# Patient Record
Sex: Male | Born: 1935 | Race: Black or African American | Hispanic: No | Marital: Single | Smoking: Former smoker
Health system: Southern US, Community
[De-identification: ages and names within clinical notes are randomized; demographics above are authoritative.]

## PROBLEM LIST (undated history)

## (undated) DIAGNOSIS — D649 Anemia, unspecified: Secondary | ICD-10-CM

## (undated) DIAGNOSIS — I351 Nonrheumatic aortic (valve) insufficiency: Secondary | ICD-10-CM

## (undated) DIAGNOSIS — I77810 Thoracic aortic ectasia: Secondary | ICD-10-CM

## (undated) DIAGNOSIS — I10 Essential (primary) hypertension: Secondary | ICD-10-CM

## (undated) DIAGNOSIS — I4891 Unspecified atrial fibrillation: Secondary | ICD-10-CM

## (undated) DIAGNOSIS — Z86010 Personal history of colonic polyps: Secondary | ICD-10-CM

## (undated) DIAGNOSIS — K648 Other hemorrhoids: Principal | ICD-10-CM

## (undated) DIAGNOSIS — Z972 Presence of dental prosthetic device (complete) (partial): Secondary | ICD-10-CM

## (undated) DIAGNOSIS — Z8673 Personal history of transient ischemic attack (TIA), and cerebral infarction without residual deficits: Secondary | ICD-10-CM

## (undated) DIAGNOSIS — E785 Hyperlipidemia, unspecified: Secondary | ICD-10-CM

## (undated) DIAGNOSIS — I639 Cerebral infarction, unspecified: Secondary | ICD-10-CM

## (undated) DIAGNOSIS — I499 Cardiac arrhythmia, unspecified: Secondary | ICD-10-CM

## (undated) DIAGNOSIS — R0789 Other chest pain: Secondary | ICD-10-CM

## (undated) DIAGNOSIS — N183 Chronic kidney disease, stage 3 (moderate): Secondary | ICD-10-CM

## (undated) DIAGNOSIS — K649 Unspecified hemorrhoids: Secondary | ICD-10-CM

## (undated) HISTORY — PX: MULTIPLE TOOTH EXTRACTIONS: SHX2053

## (undated) HISTORY — DX: Unspecified atrial fibrillation: I48.91

## (undated) HISTORY — DX: Other chest pain: R07.89

## (undated) HISTORY — DX: Personal history of colonic polyps: Z86.010

## (undated) HISTORY — DX: Personal history of transient ischemic attack (TIA), and cerebral infarction without residual deficits: Z86.73

## (undated) HISTORY — DX: Hyperlipidemia, unspecified: E78.5

## (undated) HISTORY — DX: Unspecified hemorrhoids: K64.9

## (undated) HISTORY — DX: Anemia, unspecified: D64.9

## (undated) HISTORY — DX: Other hemorrhoids: K64.8

## (undated) HISTORY — DX: Essential (primary) hypertension: I10

## (undated) HISTORY — DX: Chronic kidney disease, stage 3 (moderate): N18.3

---

## 2000-08-14 ENCOUNTER — Inpatient Hospital Stay (HOSPITAL_COMMUNITY): Admission: EM | Admit: 2000-08-14 | Discharge: 2000-08-15 | Payer: Self-pay | Admitting: Emergency Medicine

## 2000-08-14 ENCOUNTER — Encounter: Payer: Self-pay | Admitting: Emergency Medicine

## 2000-08-15 ENCOUNTER — Encounter: Payer: Self-pay | Admitting: Internal Medicine

## 2000-08-24 ENCOUNTER — Encounter: Admission: RE | Admit: 2000-08-24 | Discharge: 2000-08-24 | Payer: Self-pay

## 2003-02-24 ENCOUNTER — Encounter (INDEPENDENT_AMBULATORY_CARE_PROVIDER_SITE_OTHER): Payer: Self-pay | Admitting: *Deleted

## 2003-02-24 ENCOUNTER — Ambulatory Visit (HOSPITAL_COMMUNITY): Admission: RE | Admit: 2003-02-24 | Discharge: 2003-02-24 | Payer: Self-pay | Admitting: *Deleted

## 2003-02-24 DIAGNOSIS — Z860101 Personal history of adenomatous and serrated colon polyps: Secondary | ICD-10-CM | POA: Insufficient documentation

## 2003-02-24 DIAGNOSIS — Z86010 Personal history of colonic polyps: Secondary | ICD-10-CM

## 2003-02-24 HISTORY — DX: Personal history of adenomatous and serrated colon polyps: Z86.0101

## 2003-02-24 HISTORY — DX: Personal history of colonic polyps: Z86.010

## 2003-12-02 ENCOUNTER — Ambulatory Visit (HOSPITAL_COMMUNITY): Admission: RE | Admit: 2003-12-02 | Discharge: 2003-12-02 | Payer: Self-pay | Admitting: Internal Medicine

## 2005-03-21 ENCOUNTER — Encounter (INDEPENDENT_AMBULATORY_CARE_PROVIDER_SITE_OTHER): Payer: Self-pay | Admitting: *Deleted

## 2005-03-21 ENCOUNTER — Ambulatory Visit (HOSPITAL_COMMUNITY): Admission: RE | Admit: 2005-03-21 | Discharge: 2005-03-21 | Payer: Self-pay | Admitting: *Deleted

## 2005-05-31 ENCOUNTER — Encounter: Payer: Self-pay | Admitting: Cardiovascular Disease

## 2005-05-31 ENCOUNTER — Inpatient Hospital Stay (HOSPITAL_COMMUNITY): Admission: EM | Admit: 2005-05-31 | Discharge: 2005-06-01 | Payer: Self-pay | Admitting: Emergency Medicine

## 2005-05-31 ENCOUNTER — Ambulatory Visit: Payer: Self-pay | Admitting: Cardiovascular Disease

## 2005-06-30 DIAGNOSIS — Z8673 Personal history of transient ischemic attack (TIA), and cerebral infarction without residual deficits: Secondary | ICD-10-CM

## 2005-06-30 HISTORY — DX: Personal history of transient ischemic attack (TIA), and cerebral infarction without residual deficits: Z86.73

## 2006-05-08 ENCOUNTER — Emergency Department (HOSPITAL_COMMUNITY): Admission: EM | Admit: 2006-05-08 | Discharge: 2006-05-08 | Payer: Self-pay | Admitting: Emergency Medicine

## 2007-05-16 HISTORY — PX: COLONOSCOPY: SHX174

## 2007-05-28 ENCOUNTER — Ambulatory Visit (HOSPITAL_COMMUNITY): Admission: RE | Admit: 2007-05-28 | Discharge: 2007-05-28 | Payer: Self-pay | Admitting: *Deleted

## 2007-05-28 ENCOUNTER — Encounter (INDEPENDENT_AMBULATORY_CARE_PROVIDER_SITE_OTHER): Payer: Self-pay | Admitting: *Deleted

## 2007-05-28 DIAGNOSIS — Z860100 Personal history of colon polyps, unspecified: Secondary | ICD-10-CM

## 2007-05-28 HISTORY — DX: Personal history of colonic polyps: Z86.010

## 2007-05-28 HISTORY — DX: Personal history of colon polyps, unspecified: Z86.0100

## 2008-07-27 ENCOUNTER — Emergency Department (HOSPITAL_COMMUNITY): Admission: EM | Admit: 2008-07-27 | Discharge: 2008-07-27 | Payer: Self-pay | Admitting: *Deleted

## 2010-04-20 ENCOUNTER — Encounter
Admission: RE | Admit: 2010-04-20 | Discharge: 2010-04-20 | Payer: Self-pay | Source: Home / Self Care | Admitting: Internal Medicine

## 2010-09-27 NOTE — Op Note (Signed)
Adrian Daniels, ROWSER NO.:  [id number]   MEDICAL RECORD NO.:  [id number]          PATIENT TYPE:  AMB   LOCATION:  ENDO                         FACILITY:  [facility]   PHYSICIAN:  Georgiana Spinner, M.D.    DATE OF BIRTH:  Jun 19, 1934   DATE OF PROCEDURE:  05/28/2007  DATE OF DISCHARGE:                               OPERATIVE REPORT   PROCEDURE:  Colonoscopy.   INDICATIONS:  Colon polyps.   ANESTHESIA:  Fentanyl 50 mcg, Versed 6 mg.   PROCEDURE:  With the patient mildly sedated in the left lateral  decubitus position, a rectal examination was performed which was  unremarkable to my exam.  Subsequently the Pentax videoscopic  colonoscope was inserted in the rectum, passed under direct vision with  pressure applied to reach the cecum identified by ileocecal valve and  base of cecum both of which were photographed.  From this point,  colonoscope was slowly withdrawn taking circumferential views of colonic  mucosa stopping in the ascending colon near the hepatic flexure where a  polyp was seen, photographed and removed, first using snare cautery  technique setting of 20/150 blended current.  There was some residual  polyp remaining, so using hot biopsy forceps technique with the same  setting, we removed the remainder.  There was some bleeding oozing  mostly from this polyp site so I elected to inject epinephrine 2 mL to  it but the oozing continued so I placed a vascular clip on the area of  bleeding which seemed to stop it.  From this point the colonoscope was  then slowly withdrawn taking circumferential views of remaining colonic  mucosa after retrieving the polypoid tissue, stopping only in the rectum  which appeared normal on direct and showed hemorrhoids on retroflexed  view.  The endoscope was straightened and withdrawn.  The patient's  vital signs, pulse oximeter remained stable.  The patient tolerated  procedure well without apparent complication other than what  appeared to  be self-limited bleeding.   PLAN:  Will await biopsy report.  The patient will call me for results  and follow-up with me as needed as an outpatient.           ______________________________  Georgiana Spinner, M.D.     GMO/MEDQ  D:  05/28/2007  T:  05/28/2007  Job:  [id number]

## 2010-09-30 NOTE — Consult Note (Signed)
[facility]  Patient:    Adrian Daniels, Adrian Daniels                      MRN: [id number] Proc. Date: 08/14/00 Adm. Date:  [id number] Attending:  [name]                          Consultation Report  CHIEF COMPLAINT:  Chest pain.  HISTORY OF PRESENT ILLNESS:  This is a 76 year old African-American male with a history of hypertension, hyperlipidemia who developed substernal chest pain last evening for approximately 5 to 10 minutes but subsided and then recurred.  The pain increased with lying supine and improved with sitting up.  There was no association with activity.  There were no associated symptoms of nausea, vomiting, diaphoresis, or shortness of breath.  He awoke at 3 a.m. with severe chest pain with radiation to the left arm and hand numbness.  Cardiac risk factors include age, sex, family history of hypertension, hyperlipidemia, and history of tobacco abuse.  In the emergency room, he was given a GI cocktail and apparently his pain was relieved within 10 minutes of taking the GI cocktail.  PAST MEDICAL HISTORY:  Significant for hypertension, poorly controlled. Hyperlipidemia, not on Pravachol at this time.  Tobacco abuse, 80-pack-year. Heartburn.  Scrotal pain.  CURRENT MEDICATIONS: 1. Cardizem CD 180 mg q.d. 2. Dyazide 1 q.d. 3. Pravachol 20 mg q.d.  SOCIAL HISTORY:  He works full-time as a Psychiatric nurse.  He has an 8th grade education.  He has an 80-pack-year history of tobacco abuse.  Quit five years ago.  He usually drinks alcohol up to a pint every two weeks.  He is single.  ALLERGIES:  He has no known drug allergies.  FAMILY HISTORY:  His brother died at 45 of a MI.  His one brother died at 50, his first MI was at 15.  He has a mother who died at 35 of questionable causes.  His father died at 28 of questionable causes.  PHYSICAL EXAMINATION:  VITAL SIGNS:  Blood pressure 130 to 158/70 to 90 mmHg.  Heart rate is 60 to 70 beats  per minute.  GENERAL:  This is a well-developed, well-nourished black male in no acute distress.  HEENT:  Pupils are equal, round and reactive to light and accommodation. Extraocular muscles intact bilaterally.  No sclerae icterus.  Oropharynx is clear.  NECK:  Supple without lymphadenopathy.  No bruits.  LUNGS:  Clear to auscultation throughout.  HEART:  Regular rate and rhythm with occasional ectopy.  No murmurs, rubs, or gallops.  Normal S1 and S2.  ABDOMEN:  Soft, nontender, and nondistended with active bowel sounds.  No hepatosplenomegaly.  EXTREMITIES:  No edema.  Good distal pulses bilaterally.  LABORATORY DATA:  Sodium 142, potassium 4.5, chloride 104, BUN 17, glucose 101.  White cell count 5.1, hemoglobin 12.7, platelet count 190,000. Troponin less than 0.01 and 0.01.  CPKs 205 and 158.  MBs 2.9 and 1.2.  Total cholesterol of 236, triglycerides 111, HDL 39, LDL 175.  EKG shows sinus bradycardia with no ST or T-wave abnormality.  ASSESSMENT/PLAN: 1. Chest pain with positive cardiac risk factors including hypertension,    hyperlipidemia, family history of coronary artery disease, positive tobacco    abuse, age, and sex:  His chest pain now is very atypical for underlying    coronary disease when it is relieved with a gastrointestinal cocktail.  Electrocardiogram shows no ischemia.  He has ruled out for a myocardial    infarction.  Given his strong cardiac risk factors would proceed with    stress Cardiolite and if negative discharge to home on a proton pump    inhibitor.  It is okay at this time to discontinue heparin and intravenous    nitroglycerin drip. 2. Hypertension:  Agree with the current medication changes to stop the    Cardizem given his bradycardia and to restart his Dyazide.  We may need    to add a second agent as his blood pressure is not well controlled on    Dyazide alone. 3. Hyperlipidemia:  Currently off Pravachol.  His LDL today was 175.     Realistically, we would like to have the LDL less than 130 given that he    has greater than two cardiac risk factors for coronary artery disease.    Would restart Pravachol 20 mg a day and recheck his lipids in six weeks. DD:  08/14/00 TD:  08/14/00 Job: [id number]

## 2010-09-30 NOTE — Discharge Summary (Signed)
[place] Adrian Daniels  Patient:    Adrian Daniels, Adrian Daniels                      MRN: [id number] Adm. Date:  [id number] Disc. Date: [id number] Attending:  [name] Dictator:   Dominica Severin, M.S.-IV CC:         [name], M.D.  Outpatient Clinic  Armanda Magic, M.D.  [employer] [employer]   Discharge Summary  DISCHARGE DIAGNOSES: 1. Atypical chest pain, status post negative Cardiolite stress test. 2. Hypertension. 3. Hyperlipidemia. 4. Gastroesophageal reflux disease. 5. Scrotal pain.  DISCHARGE MEDICATIONS: 1. Pepcid 20 mg p.o. b.i.d. 2. Pravachol 20 mg p.o. b.i.d. 3. Enteric-coated aspirin 325 mg p.o. q.d. 4. Dyazide one tablet p.o. q.d.  FOLLOW-UP:  Appointment with Dominica Severin, M.S.-IV, in the outpatient clinic at 2:30 p.m. on August 24, 2000.  I would like a urinalysis and urine culture drawn at the beginning of the laboratory appointment.  PROCEDURES:  A chest x-ray on admission showed mild cardiac enlargement with no signs of edema or infiltrate.  EKG showed normal sinus rhythm with one PVC and no ST changes or Q waves.  A Cardiolite stress EKG on August 15, 2000, showed no ischemic changes or chest pain during exercise.  The blood pressure rose to 190/110 with exercise, but there were no signs of ischemia.  There was no scar.  The ejection fraction was 61%.  CONSULTANTS:  Armanda Magic, M.D., in cardiology.  HISTORY OF PRESENT ILLNESS:  This is a 76 year old African-American male who had been having substernal chest pressure at about 8:30 p.m. the night prior to admission.  The pain lasted about 5-10 minutes.  It would go away for about 10-15 minutes and then return.  It seemed to worsen when he laid down, improved when he sat up, and was unrelated to activity.  He denied nausea, vomiting, shortness of breath, palpitations, or diaphoresis.  It did not radiate.  The pain seemed to improve after a few hours.  He  awoke a few hours later and the pain was more severe.  Then it was accompanied by left hand numbness.  His pain was not relieved by Maalox, Pepcid, or aspirin.  The patients cardiac risk factors include a positive family history of MI ______ young, his age, his gender, hypertension, hyperlipidemia, and recent smoker.  SOCIAL HISTORY:  The patient has an 80-pack-year smoking history, but he quit five years ago.  He has only occasional drinking.  He does not do illicit drugs.  MEDICATIONS:  The patient is on two blood pressure medicines, but he does not know which.  He used to take Pravachol for his hyperlipidemia, but has run out.  FAMILY HISTORY:  One of his brothers died of an MI at 60.  Another brother died of an MI at 69; his first MI was at 61.  His mother died at age 38 of a CVA.  His father died at 47 of unknown causes.  REVIEW OF SYSTEMS:  As stated above.  In addition, he has been suffering from occasional scrotal pain over the last six months.  PHYSICAL EXAMINATION ON ADMISSION:  VITAL SIGNS:  Temperature 97.7 degrees, pulse 80, respiratory rate 16, oxygen saturation 99% on room air, blood pressure 205/108 (decreased to 132/89 with labetalol).  GENERAL APPEARANCE:  This is a pleasant, African-American man in no apparent distress.  HEENT:  Pupils equal, round, and reactive to light and accommodation. Anicteric.  NECK:  Supple without bruits.  LUNGS:  Clear to auscultation bilaterally.  CARDIOVASCULAR:  Regular rate and rhythm without murmurs, rubs, or gallops.  ABDOMEN:  Soft, obese.  Positive bowel sounds.  Nontender.  No hepatosplenomegaly.  No bruits.  EXTREMITIES:  No clubbing, cyanosis, or edema.  Good pulses.  GENITOURINARY:  No hernia, no tenderness, no erythema, and no discharge.  RECTAL:  Heme-negative, brown stool.  Normal prostate.  LABORATORY DATA ON ADMISSION:  White count 5.1, hemoglobin 12.7, platelets 190, ANC 30.2, ALC 1.4.  Troponin I less  than 0.1.  CK 205, MB 2.9. On August 14, 2000, at 3:30 a.m., creatinine 1.2, total bilirubin 0.8, alkaline phosphatase 65, SGOT 19, SGPT 16, total protein 7.2, albumin 4.1, and calcium 9.5.  The urinalysis is pending at the time of discharge.  HOSPITAL COURSE: #1 - CARDIOLOGY:  The patient was admitted to telemetry and cardiac enzymes were measured.  The results of these enzymes were as follows:  On August 14, 2000, 0330 hours, CK 205, CK-MB 2.9, and troponin I 0.01.  On August 14, 2000, at 1130  hours, CK 158, CK-MB 1.2, and troponin I 0.01.  On August 14, 2000, at 1930 hours, CK 129, CK-MB 1.4, and troponin I 0.02.  The results of the fourth set of the cardiac enzymes are not available to this dictator at the time of dictation.  In addition, the patient had a fasting lipid panel drawn given his reported history of hyperlipidemia.  On August 14, 2000, the total cholesterol was 236, triglycerides 111, HDL 39, LDL 175, and total cholesterol to HDL ratio 6.1.  Given these results, the patient was placed on Pravachol, a medicine which he had been on in the past.  The patient had a history of hypertension.  Medicines that he had been on were retrieved from his records at [facility], Cardizem and Dyazide.  Given that the patients blood pressure had been in the normal range during his hospital stay, the patient was placed on Dyazide alone.  Given the patients multiple cardiac risk factors, he was placed on enteric-coated aspirin 325 mg per day.  Although the patients EKG and cardiac enzymes did not show signs of ischemia, it was decided that he would undergo a Cardiolite stress test given his multiple cardiac risk factors in order to rule out possible angina or cardiovascular disease.  The  Cardiolite stress test was negative as described above in the procedure part of this discharge summary.  #2 - GASTROINTESTINAL:  The patient has a past medical history of GERD.  Given this, his reflux symptoms may  explain his current chest pain.  He reports that he has been noncompliant on his Pepcid and was restarted on Pepcid 20 mg p.o. b.i.d.  It should be noted that the patient does report that his pain completely subsided with the GI cocktail in the ED.  #3 - GENITOURINARY:  The patient reports a two to three month history of bilateral scrotal pain.  He describes this as a dull and aching pain that does not change with activity and does not effect his erectile function.  The patient had a completely normal genital exam without discharge, tenderness, or prostate enlargement.  A urinalysis was ordered, but no completed during the hospital stay.  We will recommend follow-up of this problem on an outpatient basis. DD:  08/19/00 TD:  08/19/00 Job: [id number]

## 2010-09-30 NOTE — Op Note (Signed)
NAME:  Adrian Daniels, Adrian Daniels NO.:  [id number]   MEDICAL RECORD NO.:  [id number]          PATIENT TYPE:  AMB   LOCATION:  ENDO                         FACILITY:  [facility]   PHYSICIAN:  Georgiana Spinner, M.D.    DATE OF BIRTH:  Jul 13, 1934   DATE OF PROCEDURE:  03/21/2005  DATE OF DISCHARGE:                                 OPERATIVE REPORT   __________   __________    With the patient mildly sedated in left lateral decubitus position the  Olympus videoscopic colonoscope was inserted in the rectum after a normal  rectal exam and passed under direct vision to the cecum identified by  ileocecal valve and appendiceal orifice both of which were photographed.  From this point colonoscope was slowly withdrawn taking circumferential  views of the colonic mucosa as we withdrew all the way to the rectum  stopping at the splenic flexure first where a polyp was seen, photographed  and removed using hot biopsy forceps technique setting at 20/200 blended  current.  We next stopped at 50 cm from the anal verge at which point a  polyp was seen and it was removed using snare cautery technique again with  the same setting. There was some residual polypoid tissue left that I burned  using the hot biopsy forceps to eradicate it.  We next stopped at 20 cm from  the anal verge at which point another polyp was seen.  This was photographed  and removed with hot biopsy forceps technique with same setting.  The rectum  appeared normal on direct showed hemorrhoids retroflexed view.  The  endoscope straightened withdrawn.  The patient's vital signs, pulse oximeter  remained stable. The patient tolerated procedure well without apparent  complications.   FINDINGS:  Polyps described above splenic flexure 15 to 20 cm from anal  verge.   PLAN:  Await biopsy reports. The patient will call me for results and follow-  up with me as an outpatient.           ______________________________  Georgiana Spinner,  M.D.     GMO/MEDQ  D:  03/21/2005  T:  03/21/2005  Job:  [id number]

## 2010-09-30 NOTE — Discharge Summary (Signed)
NAMECORLEY, MAFFEO NO.:  [id number]   MEDICAL RECORD NO.:  [id number]          PATIENT TYPE:  INP   LOCATION:  3031                         FACILITY:  [facility]   PHYSICIAN:  Hillery Aldo, M.D.   DATE OF BIRTH:  14-Feb-1935   DATE OF ADMISSION:  05/31/2005  DATE OF DISCHARGE:  06/01/2005                                 DISCHARGE SUMMARY   DISCHARGE DIAGNOSES:  1.  Transient ischemic attack.  2.  Hypertension.  3.  Hyperlipidemia.  4.  Gastroesophageal reflux disease.  5.  Adenomatous polyps.  6.  Thoracic aortic aneurysm.   DISCHARGE MEDICATIONS:  1.  Aggrenox 1 capsule b.i.d.  2.  Avapro 300 milligrams daily.  3.  Lipitor 80 milligrams daily.  4.  Cardizem LA 360 milligrams daily.  5.  Triamterene/HCTZ 37.5/25 one p.o. daily.   CONSULTATIONS:  None.   BRIEF ADMISSION/HISTORY OF PRESENT ILLNESS:  Mr. Meech is a 76 year old  male with past medical history of hypertension and hyperlipidemia who  presented to [facility] ER with a new onset of left-sided numbness. It had  completely gone by the time he was seen in the emergency department. He was  admitted for full stroke workup.   PROCEDURES/DIAGNOSTIC STUDIES:  1.  MRI of the brain on May 31, 2005 showed no acute infarct or abnormal      intracranial enhancing lesions. There was moderate nonspecific white      mater-type changes related to sequelae of small vessel disease. MRA      showed mild intracranial atherosclerotic-type changes predominantly      involving the branch vessel.  2.  2-D echocardiogram on May 31, 2005 revealed normal LV function.  3.  Swallowing evaluation on May 31, 2005 showed normal swallowing      function.  4.  Carotid Dopplers on June 01, 2005 showed no significant ICA stenosis      bilaterally with vertebral artery flow antegrade.   HOSPITAL COURSE:  Problem 1:  FOCAL NEUROLOGIC DEFICITS CONSISTENT WITH  TRANSIENT ISCHEMIC ATTACK: The patient was admitted  and was evaluated by the  stroke team. He was not felt to be tPA candidate secondary to his rapidly  improving clinical course and his greater than three-hour presentation since  onset. Nonetheless, he received a full stroke evaluation including MRI/MRA,  2-D echocardiogram, carotid Doppler ultrasonography and a check of his  homocystine and lipid studies. Additionally, because of his left arm  symptoms, cardiac enzymes were cycled and found to be negative. Medical  management was undertaken with the switch of his aspirin to Aggrenox and  with focus on the control of his hypertension to a systolic pressure less  than 130 and strict control of his lipids. Given this, his Lipitor dose was  increased. He was not found to be hyperhomocysteinemia.   Problem 2:  HYPERTENSION: The patient was restarted on his home medications  once he was cleared from having had a stroke. He should see his primary care  physician in close follow-up and the goal would be to keep his systolic  blood pressure less than 130.  Problem 3:  HYPERLIPIDEMIA: The patient's fasting lipid panel was checked  with a total cholesterol found to be 164, triglycerides 58, HDL 49 and LDL  103. Given his elevated LDL, his Lipitor was increased to 80 milligrams  daily. He should have a check of his liver function studies and a repeat  fasting lipid panel in approximately six weeks to ensure that he is  optimally controlled.   DISPOSITION:  The patient is discharged home. He was evaluated by both  physical therapy and occupational therapy who did not find any deficits in  his motor function. He should follow up with his primary care physician in 1-  2 weeks. He should maintain a low-fat and low-cholesterol diet. Low salt as  well. He is instructed to call 9-1-1 if he experiences any focal neurologic  deficits.   CONDITION ON DISCHARGE:  Improved.           ______________________________  Hillery Aldo, M.D.     CR/MEDQ   D:  06/01/2005  T:  06/01/2005  Job:  [id number]

## 2010-09-30 NOTE — H&P (Signed)
NAMELEMOND, Adrian NO.:  [id number]   MEDICAL RECORD NO.:  [id number]          PATIENT TYPE:  INP   LOCATION:  1824                         FACILITY:  [facility]   PHYSICIAN:  Lonia Blood, M.D.       DATE OF BIRTH:  1935/03/15   DATE OF ADMISSION:  05/31/2005  DATE OF DISCHARGE:                                HISTORY & PHYSICAL   PRIMARY CARE PHYSICIAN:  Dr. Juline Patch   CHIEF COMPLAINT:  Left side numbness.   HISTORY OF PRESENT ILLNESS:  Mr. Hayter is a 76 year old African-American  man with history of hypertension, hyperlipidemia, and a strong family  history of CVA and coronary artery disease.  Presented to [facility]  Emergency Room with new onset left side numbness.  He reports the above  sensation to come up when he was watching T.V. around 1 a.m.  Initially the  upper extremity and the lower extremity was effected.  He apparently reports  that then his left upper extremity became less numb and his left lower  extremity remained numb.  He also reports that he was clumsy at some point  in his left upper extremity.  Currently, he does not report any weakness in  the left side.  He also denies any chest pain or shortness of breath with  this episode.  In the past Mr. Durden has been evaluated for possible  coronary artery disease and he had negative stress test in 2002.   HOME MEDICATIONS:  Cardizem, Avapro, Lipitor, and aspirin 81 mg daily.   ALLERGIES:  No known drug allergies.   PAST MEDICAL HISTORY:  1.  Hypertension.  2.  Hyperlipidemia.  3.  Thoracic aortic aneurysm measuring 4.3 x 4.2 cm.  4.  Edematous polyps found on colonoscopy in 2006.  5.  Gastroesophageal reflux disease.   SOCIAL HISTORY:  Mr. Bohlman is a former tobacco user at 80-pack-years.  Quit about eight years ago.  He drinks alcohol.  Worked in [employer].  Is  single.  Lives alone.  He has an eighth grade education.   FAMILY HISTORY:  His mother died at age 58 with a stroke.   His father died  at age 50 of unknown cause.  He had a brother die at age 42 with an MI.  Another brother died at age 56 with another MI.   REVIEW OF SYSTEMS:  Negative for chest pain.  Negative for headaches.  Negative for nausea.  Negative for vomiting.  Negative for abdominal pain.  Other systems per HPI.  All other systems are negative.   PHYSICAL EXAMINATION:  VITAL SIGNS:  Temperature 97, pulse 68, respirations  18, blood pressure 176/91, saturation 97% on room air.  GENERAL:  Patient appears well-developed, well-nourished, in no acute  distress.  Alert and oriented to place, person, and time.  HEENT:  Head is normocephalic, atraumatic.  Eyes have pupils equal and  round, react to light and accommodation.  Extraocular movements intact.  Sclerae anicteric.  Conjunctivae are pink.  Throat is clear.  NECK:  Supple without JVD.  No carotid bruits.  No  thyromegaly.  CHEST:  Clear to auscultation bilaterally without wheeze, rhonchi, or  crackles.  HEART:  Regular rate and rhythm without murmurs, rubs, or gallops.  ABDOMEN:  Soft, nontender, nondistended.  Bowel sounds are present.  There  is no palpable hepatosplenomegaly.  SKIN:  Warm and dry.  There is no suspicious rashes.  EXTREMITIES:  No edema.  MUSCULOSKELETAL:  Good bulk and tone.  NEUROLOGIC:  Cranial nerves III-XII are intact.  Patient has some slight  dysarthria, but 5/5 strength in upper extremity, lower extremity  bilaterally.  There is no appreciable pronator drift.  Deep tendon reflexes  are +2 and symmetric.  Sensation is intact.   LABORATORIES:  Urinalysis positive for some hemoglobin, otherwise within  normal limits.  White blood cell count 4.7, hemoglobin 12.5, platelet count  241.  Sodium 139, potassium 3.4, chloride 106, bicarbonate 29, BUN 10,  creatinine 1.1, glucose 97.  Liver function tests are within normal limits.  EKG shows normal sinus rhythm and a first degree AV block.  Head CT:  Some  small vessel  disease and no acute findings.   ASSESSMENT/PLAN:  1.  Transient ischemic attack versus cerebrovascular accident.  Mr.      Eiben's symptoms are consistent with the possibility of an ischemic      stroke in the right ACA distribution versus a lacunar infarct.  Plan is      to admit Mr. Broady to the telemetry unit, obtain MRI of his brain,      obtain carotid ultrasounds and 2-D echocardiogram.  Also, given his      history of left hand numbness will obtain three sets of cardiac enzymes,      rule out a myocardial event.  We will continue patient's aspirin for now      until we have a proven presence of a cerebrovascular accident and will      continue his Lipitor.  2.  Hypertension.  For now in the setting of an acute event we will be      holding the blood pressure medication and will follow up clinically.  3.  Hypokalemia.  Will replace patient's potassium.  4.  Microscopic hematuria.  Will repeat the UA and outpatient urology follow      up if the hematuria remains persistent.  5.  Anemia.  Patient had a recent colonoscopy with some polyps findings.      Will follow CBC to assure stability of hemoglobin.  6.  Deep venous thrombosis prophylaxis.  Will begin using Lovenox.      Lonia Blood, M.D.  Electronically Signed     SL/MEDQ  D:  05/31/2005  T:  05/31/2005  Job:  [id number]   cc:   Juline Patch, M.D.  Fax: [phone]

## 2010-09-30 NOTE — Op Note (Signed)
   NAME:  Adrian Daniels, Adrian Daniels NO.:  [id number]   MEDICAL RECORD NO.:  [id number]                   PATIENT TYPE:  AMB   LOCATION:  ENDO                                 FACILITY:  [facility]   PHYSICIAN:  Georgiana Spinner, M.D.                 DATE OF BIRTH:  06/21/1935   DATE OF PROCEDURE:  02/24/2003  DATE OF DISCHARGE:                                 OPERATIVE REPORT   PROCEDURE:  Colonoscopy.   INDICATIONS FOR PROCEDURE:  Colon polyp.   ANESTHESIA:  Demerol 60, Versed 6 mg.   DESCRIPTION OF PROCEDURE:  With the patient mildly sedated in the left  lateral decubitus position, the Olympus videoscopic colonoscope was inserted  in the rectum and passed under direct vision to the cecum identified by the  ileocecal valve and appendiceal orifice, both of which were photographed.  From this point, the colonoscope was slowly withdrawn taking circumferential  views of the colonic mucosa, stopping only in the rectosigmoid area at 20 cm  from the anal verge at which point, the polyp was seen, photographed, and  removed using snare cautery technique, setting of 20/20 blended current.  The endoscope and tissue was retrieved by suction.  The endoscope was  withdrawn further to the rectum which appeared normal on direct and  retroflexed view.  The endoscope was straightened and withdrawn.  The  patient's vital signs and pulse oximetry remained stable.  The patient  tolerated the procedure well without apparent complications.   FINDINGS:  Small polyp in the rectosigmoid removed.   PLAN:  Await biopsy report.  The patient will call me for results and follow  up with me as an outpatient.                                               Georgiana Spinner, M.D.    GMO/MEDQ  D:  02/24/2003  T:  02/24/2003  Job:  [id number]

## 2010-12-29 ENCOUNTER — Emergency Department (HOSPITAL_COMMUNITY)
Admission: EM | Admit: 2010-12-29 | Discharge: 2010-12-30 | Disposition: A | Discharge status: Home / Self Care | Payer: Medicare Other | Attending: Emergency Medicine | Admitting: Emergency Medicine

## 2010-12-29 DIAGNOSIS — I10 Essential (primary) hypertension: Secondary | ICD-10-CM | POA: Insufficient documentation

## 2010-12-29 DIAGNOSIS — Y92009 Unspecified place in unspecified non-institutional (private) residence as the place of occurrence of the external cause: Secondary | ICD-10-CM | POA: Insufficient documentation

## 2010-12-29 DIAGNOSIS — S0180XA Unspecified open wound of other part of head, initial encounter: Secondary | ICD-10-CM | POA: Insufficient documentation

## 2011-10-17 DIAGNOSIS — I10 Essential (primary) hypertension: Secondary | ICD-10-CM | POA: Diagnosis not present

## 2011-10-18 DIAGNOSIS — I10 Essential (primary) hypertension: Secondary | ICD-10-CM | POA: Diagnosis not present

## 2011-10-18 DIAGNOSIS — E7800 Pure hypercholesterolemia, unspecified: Secondary | ICD-10-CM | POA: Diagnosis not present

## 2012-02-14 DIAGNOSIS — K922 Gastrointestinal hemorrhage, unspecified: Secondary | ICD-10-CM | POA: Diagnosis not present

## 2012-03-06 ENCOUNTER — Other Ambulatory Visit: Payer: Self-pay | Admitting: Internal Medicine

## 2012-03-06 DIAGNOSIS — R7989 Other specified abnormal findings of blood chemistry: Secondary | ICD-10-CM

## 2012-03-06 DIAGNOSIS — R799 Abnormal finding of blood chemistry, unspecified: Secondary | ICD-10-CM | POA: Diagnosis not present

## 2012-03-13 ENCOUNTER — Ambulatory Visit
Admission: RE | Admit: 2012-03-13 | Discharge: 2012-03-13 | Disposition: A | Discharge status: Home / Self Care | Payer: Medicare Other | Source: Ambulatory Visit | Attending: Internal Medicine | Admitting: Internal Medicine

## 2012-03-13 DIAGNOSIS — R7989 Other specified abnormal findings of blood chemistry: Secondary | ICD-10-CM

## 2012-03-13 DIAGNOSIS — R799 Abnormal finding of blood chemistry, unspecified: Secondary | ICD-10-CM | POA: Diagnosis not present

## 2012-04-19 DIAGNOSIS — I10 Essential (primary) hypertension: Secondary | ICD-10-CM | POA: Diagnosis not present

## 2012-04-24 DIAGNOSIS — I10 Essential (primary) hypertension: Secondary | ICD-10-CM | POA: Diagnosis not present

## 2012-04-24 DIAGNOSIS — R634 Abnormal weight loss: Secondary | ICD-10-CM | POA: Diagnosis not present

## 2012-04-24 DIAGNOSIS — H6120 Impacted cerumen, unspecified ear: Secondary | ICD-10-CM | POA: Diagnosis not present

## 2012-04-24 DIAGNOSIS — J309 Allergic rhinitis, unspecified: Secondary | ICD-10-CM | POA: Diagnosis not present

## 2012-04-24 DIAGNOSIS — E785 Hyperlipidemia, unspecified: Secondary | ICD-10-CM | POA: Diagnosis not present

## 2012-04-24 DIAGNOSIS — Z23 Encounter for immunization: Secondary | ICD-10-CM | POA: Diagnosis not present

## 2012-04-26 ENCOUNTER — Encounter: Payer: Self-pay | Admitting: Gastroenterology

## 2012-04-30 ENCOUNTER — Ambulatory Visit: Payer: Medicare Other | Admitting: Gastroenterology

## 2012-05-23 ENCOUNTER — Encounter: Payer: Self-pay | Admitting: Gastroenterology

## 2012-05-23 ENCOUNTER — Ambulatory Visit (INDEPENDENT_AMBULATORY_CARE_PROVIDER_SITE_OTHER): Payer: Medicare Other | Admitting: Gastroenterology

## 2012-05-23 VITALS — BP 152/84 | HR 74 | Ht 71.0 in | Wt 191.8 lb

## 2012-05-23 DIAGNOSIS — Z86010 Personal history of colonic polyps: Secondary | ICD-10-CM

## 2012-05-23 MED ORDER — PEG-KCL-NACL-NASULF-NA ASC-C 100 G PO SOLR: 1.0000 | Freq: Once | ORAL | Status: DC

## 2012-05-23 NOTE — Patient Instructions (Addendum)
You have been scheduled for a colonoscopy with propofol. Please follow written instructions given to you at your visit today.  Please pick up your prep kit at the pharmacy within the next 1-3 days. If you use inhalers (even only as needed) or a CPAP machine, please bring them with you on the day of your procedure. We have sent the following medications to your pharmacy for you to pick up at your convenience: MOVI PREP CC:  Juline Patch MD

## 2012-05-23 NOTE — Progress Notes (Signed)
History of Present Illness:  This is a 78 year old African American male former patient of Dr. Sabino Gasser at [facility].  He had a colonoscopy in 2006 with removal of a benign polyp, and had a larger hepatic flexure polyp piecemeal excised in 2009.  There is not been colonoscopy followup since that time.  The patient now presents with intermittent rectal bleeding, but otherwise is asymptomatic.  He denies upper GI, hepatobiliary, or other general medical problems and works every day.  His appetite is good, he denies food intolerances.  He is on aspirin 325 mg a day and antihypertensive medications.  Recent review of lab data shows normal CBC a metabolic profile.  Family history is noncontributory.  I have reviewed this patient's present history, medical and surgical past history, allergies and medications.     ROS: The remainder of the 10 point ROS is negative     Physical Exam: Blood pressure 152/84, pulse 74, and weight 191 with a BMI of 26.75. General well developed well nourished patient in no acute distress, appearing their stated age Eyes PERRLA, no icterus, fundoscopic exam per opthamologist Skin no lesions noted Neck supple, no adenopathy, no thyroid enlargement, no tenderness Chest clear to percussion and auscultation Heart no significant murmurs, gallops or rubs noted Abdomen no hepatosplenomegaly masses or tenderness, BS normal.  Rectal inspection normal no fissures, or fistulae noted.  No masses or tenderness on digital exam. Stool guaiac negative. Extremities no acute joint lesions, edema, phlebitis or evidence of cellulitis. Neurologic patient oriented x 3, cranial nerves intact, no focal neurologic deficits noted. Psychological mental status normal and normal affect.  Assessment and plan: This patient is due for followup colonoscopy exam per his prominent hepatic flexure polyp.  I cannot find pathology report from 2009.  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  I have set him up for colonoscopy as per standard protocol with adjustments in his medications as needed.  I did not repeat any laboratory parameters at today.  Risk and benefits of colonoscopy were explained in detail to the patient and his door, and we will proceed as planned with conscious sedation and propofol nurse anesthesia.  These copies primary care doctor Dr. Juline Patch.  Previous colonoscopy reported internal hemorrhoids, and I suspect that accounts for his bright red blood per rectum.  I cannot feel any rectal masses on digital exam, and stool is guaiac-negative. Encounter Diagnosis  Name Primary?  Marland Kitchen Hx of colonic polyps Yes

## 2012-05-27 ENCOUNTER — Encounter: Payer: Medicare Other | Admitting: Gastroenterology

## 2012-05-28 DIAGNOSIS — R42 Dizziness and giddiness: Secondary | ICD-10-CM | POA: Diagnosis not present

## 2012-05-28 DIAGNOSIS — I10 Essential (primary) hypertension: Secondary | ICD-10-CM | POA: Diagnosis not present

## 2012-05-28 DIAGNOSIS — R5383 Other fatigue: Secondary | ICD-10-CM | POA: Diagnosis not present

## 2012-05-28 DIAGNOSIS — R5381 Other malaise: Secondary | ICD-10-CM | POA: Diagnosis not present

## 2012-06-05 ENCOUNTER — Encounter: Payer: Self-pay | Admitting: *Deleted

## 2012-06-05 DIAGNOSIS — R42 Dizziness and giddiness: Secondary | ICD-10-CM | POA: Diagnosis not present

## 2012-06-05 DIAGNOSIS — R55 Syncope and collapse: Secondary | ICD-10-CM | POA: Diagnosis not present

## 2012-06-10 DIAGNOSIS — R42 Dizziness and giddiness: Secondary | ICD-10-CM | POA: Diagnosis not present

## 2012-06-10 DIAGNOSIS — R55 Syncope and collapse: Secondary | ICD-10-CM | POA: Diagnosis not present

## 2012-07-08 ENCOUNTER — Telehealth: Payer: Self-pay | Admitting: *Deleted

## 2012-07-08 NOTE — Telephone Encounter (Signed)
Pt was to see his PCP before another COLON could be r/s. Called pt today who stated he will call tomorrow to r/s the COLON; he has seen Dr Ricki Miller.

## 2012-07-08 NOTE — Telephone Encounter (Signed)
Message copied by Florene Glen on Mon Jul 08, 2012  4:27 PM ------      Message from: Florene Glen      Created: Wed Jun 12, 2012 11:26 AM       Will need i care re-evaluation before scheduling for colonoincopy...went to ER today before exam and thus cancelled ------

## 2012-07-17 DIAGNOSIS — R634 Abnormal weight loss: Secondary | ICD-10-CM | POA: Diagnosis not present

## 2012-07-23 DIAGNOSIS — I10 Essential (primary) hypertension: Secondary | ICD-10-CM | POA: Diagnosis not present

## 2012-07-23 DIAGNOSIS — Z0000 Encounter for general adult medical examination without abnormal findings: Secondary | ICD-10-CM | POA: Diagnosis not present

## 2012-10-03 ENCOUNTER — Emergency Department (HOSPITAL_COMMUNITY)
Admission: EM | Admit: 2012-10-03 | Discharge: 2012-10-03 | Disposition: A | Discharge status: Home / Self Care | Payer: Medicare Other | Attending: Emergency Medicine | Admitting: Emergency Medicine

## 2012-10-03 ENCOUNTER — Encounter (HOSPITAL_COMMUNITY): Payer: Self-pay | Admitting: Emergency Medicine

## 2012-10-03 DIAGNOSIS — Z9889 Other specified postprocedural states: Secondary | ICD-10-CM | POA: Diagnosis not present

## 2012-10-03 DIAGNOSIS — E785 Hyperlipidemia, unspecified: Secondary | ICD-10-CM | POA: Diagnosis not present

## 2012-10-03 DIAGNOSIS — Z79899 Other long term (current) drug therapy: Secondary | ICD-10-CM | POA: Diagnosis not present

## 2012-10-03 DIAGNOSIS — Z862 Personal history of diseases of the blood and blood-forming organs and certain disorders involving the immune mechanism: Secondary | ICD-10-CM | POA: Insufficient documentation

## 2012-10-03 DIAGNOSIS — Z860100 Personal history of colon polyps, unspecified: Secondary | ICD-10-CM | POA: Insufficient documentation

## 2012-10-03 DIAGNOSIS — I10 Essential (primary) hypertension: Secondary | ICD-10-CM | POA: Diagnosis not present

## 2012-10-03 DIAGNOSIS — K921 Melena: Secondary | ICD-10-CM | POA: Insufficient documentation

## 2012-10-03 DIAGNOSIS — K648 Other hemorrhoids: Secondary | ICD-10-CM | POA: Insufficient documentation

## 2012-10-03 DIAGNOSIS — Z8679 Personal history of other diseases of the circulatory system: Secondary | ICD-10-CM | POA: Insufficient documentation

## 2012-10-03 DIAGNOSIS — K625 Hemorrhage of anus and rectum: Secondary | ICD-10-CM | POA: Insufficient documentation

## 2012-10-03 DIAGNOSIS — Z87891 Personal history of nicotine dependence: Secondary | ICD-10-CM | POA: Insufficient documentation

## 2012-10-03 LAB — CBC WITH DIFFERENTIAL/PLATELET
Basophils Relative: 0 % (ref 0–1)
Eosinophils Absolute: 0 10*3/uL (ref 0.0–0.7)
Eosinophils Relative: 1 % (ref 0–5)
MCV: 83.5 fL (ref 78.0–100.0)
Monocytes Relative: 5 % (ref 3–12)
RBC: 4.06 MIL/uL — ABNORMAL LOW (ref 4.22–5.81)

## 2012-10-03 NOTE — ED Provider Notes (Signed)
History     CSN: [id number]  Arrival date & time 10/03/12  1140   First MD Initiated Contact with Patient 10/03/12 1155      Chief Complaint  Patient presents with  . Rectal Pain    (Consider location/radiation/quality/duration/timing/severity/associated sxs/prior treatment) HPI Comments: Patient with history of hemorrhoids and occasional, intermittent bleeding in the past presents today after having a large amount of bleeding with a bowel movement this morning. Patient estimates that he bled about "a half pint of blood". Blood was bright red. He did not pass out or feel short of breath. Prior to this morning, patient has not had bleeding in a week. He denies rectal pain. He is not on any blood thinners except for aspirin. Onset of symptoms acute. Course is resolved. Nothing makes symptoms better worse. History of colon polyps on previous colonoscopy in 2009.  The history is provided by the patient.    Past Medical History  Diagnosis Date  . History of colon polyps 05-28-2007    Adenomatous polyp(Colonoscopy with Dr. Virginia Rochester)  . Hypertension   . Hemorrhoids   . Hyperlipemia   . Anemia, unspecified     Past Surgical History  Procedure Laterality Date  . Unremarkable      Family History  Problem Relation Age of Onset  . Diabetes Daughter   . Cervical cancer Sister   . Colon cancer Neg Hx   . Colon polyps Neg Hx     History  Substance Use Topics  . Smoking status: Former Games developer  . Smokeless tobacco: Never Used  . Alcohol Use: Yes     Comment: 4 per week      Review of Systems  Constitutional: Negative for fever.  HENT: Negative for sore throat and rhinorrhea.   Eyes: Negative for redness.  Respiratory: Negative for cough.   Cardiovascular: Negative for chest pain.  Gastrointestinal: Positive for blood in stool. Negative for nausea, vomiting, abdominal pain and diarrhea.  Genitourinary: Negative for dysuria.  Musculoskeletal: Negative for myalgias.  Skin: Negative  for rash.  Neurological: Negative for headaches.    Allergies  Review of patient's allergies indicates no known allergies.  Home Medications   Current Outpatient Rx  Name  Route  Sig  Dispense  Refill  . atorvastatin (LIPITOR) 40 MG tablet   Oral   Take 40 mg by mouth daily.         Marland Kitchen diltiazem (CARDIZEM CD) 300 MG 24 hr capsule   Oral   Take 300 mg by mouth daily.         Marland Kitchen olmesartan (BENICAR) 40 MG tablet   Oral   Take 40 mg by mouth daily.         Marland Kitchen triamterene-hydrochlorothiazide (DYAZIDE) 37.5-25 MG per capsule   Oral   Take 1 capsule by mouth every morning.           BP 153/89  Pulse 76  Temp(Src) 99 F (37.2 C) (Oral)  Resp 16  Wt 194 lb 5 oz (88.14 kg)  BMI 27.11 kg/m2  SpO2 96%  Physical Exam  Nursing note and vitals reviewed. Constitutional: He appears well-developed and well-nourished.  HENT:  Head: Normocephalic and atraumatic.  Eyes: Conjunctivae are normal. Right eye exhibits no discharge. Left eye exhibits no discharge.  Neck: Normal range of motion. Neck supple.  Cardiovascular: Normal rate, regular rhythm and normal heart sounds.   Pulmonary/Chest: Effort normal and breath sounds normal.  Abdominal: Soft. There is no tenderness.  Genitourinary: Rectal exam  shows internal hemorrhoid (2, prolapsed, reducible). Rectal exam shows no external hemorrhoid, no fissure, no mass, no tenderness and anal tone normal.  Neurological: He is alert.  Skin: Skin is warm and dry.  Psychiatric: He has a normal mood and affect.    ED Course  Procedures (including critical care time)  Labs Reviewed  CBC WITH DIFFERENTIAL - Abnormal; Notable for the following:    RBC 4.06 (*)    Hemoglobin 11.8 (*)    HCT 33.9 (*)    Platelets 135 (*)    All other components within normal limits   No results found.   1. Rectal bleeding     12:18 PM Patient seen and examined. Pt changing into gown.    Vital signs reviewed and are as follows: Filed Vitals:    10/03/12 1147  BP: 153/89  Pulse: 76  Temp: 99 F (37.2 C)  Resp: 16   Patient examined with nurse chaperone Thayer Ohm). Patient d/w and seen by Dr. Anitra Lauth.   CBC demonstrates hemoglobin in 11's. Patient has had no further rectal bleeding in emergency department. Patient will followup with PCP and his gastroenterologist.  Patient urged to return with worsening bleeding, weakness, lightheadedness or syncope.   MDM  Lower GI bleeding suspected gross bloody stool, bright red blood per rectum. Patient has internal hemorrhoids on exam but no obvious bleeding from these at time of exam.   Bleeding stopped prior to ED arrival.   The following differential diagnoses were considered for this patient's lower GI bleed: *Hemorrhoids *Colonic polyp - history of same *Proctitis - usually associated with passage of mucus and diarrhea *Diverticulosis - large volume of painless bleeding, >40yo *Arteriovenous malformation  None of the following red flags identified or suspected: *Abnormal vital signs *Symptoms suggestive of malignancy such as constitutional symptoms (fever, weight loss), anemia, or change in frequency, caliber or consistency of stools * Family history of colon cancer  Patient has no abdominal pain in history or exam.   The patient appears reasonably screened and/or stabilized for discharge and I doubt any other medical condition or other emergency medical conditions requiring further screening, evaluation, or treatment in the ED at this time prior to discharge.  Age greater than 40 -- GI/PCP referral for sigmoidoscopy or colonoscopy        Renne Crigler, PA-C 10/03/12 1510

## 2012-10-03 NOTE — ED Notes (Signed)
Had hard bm this am and now hemorrids are bleeding is not on blood thinners but takes an asa

## 2012-10-03 NOTE — ED Provider Notes (Signed)
Medical screening examination/treatment/procedure(s) were conducted as a shared visit with non-physician practitioner(s) and myself.  I personally evaluated the patient during the encounter Patient with bright red rectal bleeding today when he used the bathroom. 2 reducible internal hemorrhoids on exam that are not thrombosed. Patient has no abdominal pain, syncope, dizziness, chest pain or shortness of breath. Hemoglobin stable  Gwyneth Sprout, MD 10/03/12 1523

## 2012-10-08 ENCOUNTER — Telehealth: Payer: Self-pay | Admitting: *Deleted

## 2012-10-08 NOTE — Telephone Encounter (Signed)
Message copied by Florene Glen on Tue Oct 08, 2012  8:35 AM ------      Message from: Jarold Motto, DAVID R      Created: Fri Oct 04, 2012 10:46 AM       He needs a primary care followup.  He never completed his colonoscopy exam as we requested. ------

## 2012-10-14 NOTE — Telephone Encounter (Signed)
Pt never called to schedule and was seen in the ER for rectal bleeding on 10/03/12. Wrote pt a letter asking him to call us.

## 2012-10-23 DIAGNOSIS — K625 Hemorrhage of anus and rectum: Secondary | ICD-10-CM | POA: Diagnosis not present

## 2012-10-23 DIAGNOSIS — I10 Essential (primary) hypertension: Secondary | ICD-10-CM | POA: Diagnosis not present

## 2012-10-28 ENCOUNTER — Encounter: Payer: Self-pay | Admitting: Gastroenterology

## 2012-11-04 ENCOUNTER — Encounter: Payer: Self-pay | Admitting: Gastroenterology

## 2012-11-22 ENCOUNTER — Ambulatory Visit (AMBULATORY_SURGERY_CENTER): Payer: Medicare Other | Admitting: *Deleted

## 2012-11-22 VITALS — Ht 71.0 in | Wt 198.4 lb

## 2012-11-22 DIAGNOSIS — Z86010 Personal history of colonic polyps: Secondary | ICD-10-CM

## 2012-11-22 MED ORDER — PEG-KCL-NACL-NASULF-NA ASC-C 100 G PO SOLR: 1.0000 | Freq: Once | ORAL | Status: DC

## 2012-11-22 NOTE — Progress Notes (Signed)
No egg or soy allergy. No anesthesia problems.  

## 2012-12-06 ENCOUNTER — Ambulatory Visit (AMBULATORY_SURGERY_CENTER): Payer: Medicare Other | Admitting: Gastroenterology

## 2012-12-06 ENCOUNTER — Encounter: Payer: Self-pay | Admitting: Gastroenterology

## 2012-12-06 VITALS — BP 164/75 | HR 59 | Temp 96.4°F | Resp 17 | Ht 71.0 in | Wt 198.0 lb

## 2012-12-06 DIAGNOSIS — Z860100 Personal history of colon polyps, unspecified: Secondary | ICD-10-CM

## 2012-12-06 DIAGNOSIS — Z1211 Encounter for screening for malignant neoplasm of colon: Secondary | ICD-10-CM | POA: Diagnosis not present

## 2012-12-06 DIAGNOSIS — I10 Essential (primary) hypertension: Secondary | ICD-10-CM | POA: Diagnosis not present

## 2012-12-06 DIAGNOSIS — D649 Anemia, unspecified: Secondary | ICD-10-CM | POA: Diagnosis not present

## 2012-12-06 MED ORDER — SODIUM CHLORIDE 0.9 % IV SOLN: 500.0000 mL | INTRAVENOUS | Status: DC

## 2012-12-06 NOTE — Progress Notes (Signed)
Procedure ends, to recovery, report given and VSS. 

## 2012-12-06 NOTE — Progress Notes (Signed)
Patient did not experience any of the following events: a burn prior to discharge; a fall within the facility; wrong site/side/patient/procedure/implant event; or a hospital transfer or hospital admission upon discharge from the facility. (G8907) Patient did not have preoperative order for IV antibiotic SSI prophylaxis. (G8918)  

## 2012-12-06 NOTE — Progress Notes (Signed)
NO EGG OR SOY ALLERGY. EMW 

## 2012-12-06 NOTE — Op Note (Addendum)
[facility] [address] [name], [address]   COLONOSCOPY PROCEDURE REPORT  PATIENT: Adrian Daniels, Adrian Daniels  MR#: [id number] BIRTHDATE: 15-Nov-1934 , 78  yrs. old GENDER: Male ENDOSCOPIST: Mardella Layman, MD, Clementeen Graham REFERRED BY:  Juline Patch, M.D. PROCEDURE DATE:  12/06/2012 PROCEDURE:   Colonoscopy, surveillance ASA CLASS:   Class II INDICATIONS:Patient's personal history of adenomatous colon polyps.  MEDICATIONS: Propofol (Diprivan) 160 mg IV  DESCRIPTION OF PROCEDURE:   After the risks and benefits and of the procedure were explained, informed consent was obtained.  A digital rectal exam revealed no abnormalities of the rectum.    The LB [id number]  endoscope was introduced through the anus and advanced to the cecum, which was identified by both the appendix and ileocecal valve .  The quality of the prep was adequate. .  The instrument was then slowly withdrawn as the colon was fully examined.     COLON FINDINGS: A normal appearing cecum, ileocecal valve, and appendiceal orifice were identified.  The ascending, hepatic flexure, transverse, splenic flexure, descending, sigmoid colon and rectum appeared unremarkable.  No polyps or cancers were seen.Small right colon lipoma and small cecal telangiectasiae noted,no bleeding.     Retroflexed views revealed external/internal hemorrhoids.     The scope was then withdrawn from the patient and the procedure completed.  COMPLICATIONS: There were no complications. ENDOSCOPIC IMPRESSION: Normal colon ..no recurrent polyps noted,small cecal telangiectasiae noted.  RECOMMENDATIONS: 1.  Continue current medications 2.  Given your age, you will not need another colonoscopy for colon cancer screening or polyp surveillance.  These types of tests usually stop around the age 79. 3. Consider hemorrhoidal banding with Dr. Leone Payor if bleeding worsens   REPEAT  EXAM:  cc:  _______________________________ eSigned:  Mardella Layman, MD, [facility] 12/06/2012 10:49 AM Revised: 12/06/2012 10:49 AM

## 2012-12-06 NOTE — Patient Instructions (Addendum)
Discharge instructions given with verbal understanding. Normal exam. Resume previous medications. YOU HAD AN ENDOSCOPIC PROCEDURE TODAY AT THE [facility]: Refer to the procedure report that was given to you for any specific questions about what was found during the examination.  If the procedure report does not answer your questions, please call your gastroenterologist to clarify.  If you requested that your care partner not be given the details of your procedure findings, then the procedure report has been included in a sealed envelope for you to review at your convenience later.  YOU SHOULD EXPECT: Some feelings of bloating in the abdomen. Passage of more gas than usual.  Walking can help get rid of the air that was put into your GI tract during the procedure and reduce the bloating. If you had a lower endoscopy (such as a colonoscopy or flexible sigmoidoscopy) you may notice spotting of blood in your stool or on the toilet paper. If you underwent a bowel prep for your procedure, then you may not have a normal bowel movement for a few days.  DIET: Your first meal following the procedure should be a light meal and then it is ok to progress to your normal diet.  A half-sandwich or bowl of soup is an example of a good first meal.  Heavy or fried foods are harder to digest and may make you feel nauseous or bloated.  Likewise meals heavy in dairy and vegetables can cause extra gas to form and this can also increase the bloating.  Drink plenty of fluids but you should avoid alcoholic beverages for 24 hours.  ACTIVITY: Your care partner should take you home directly after the procedure.  You should plan to take it easy, moving slowly for the rest of the day.  You can resume normal activity the day after the procedure however you should NOT DRIVE or use heavy machinery for 24 hours (because of the sedation medicines used during the test).    SYMPTOMS TO REPORT IMMEDIATELY: A gastroenterologist  can be reached at any hour.  During normal business hours, 8:30 AM to 5:00 PM Monday through Friday, call [phone].  After hours and on weekends, please call the GI answering service at [phone] who will take a message and have the physician on call contact you.   Following lower endoscopy (colonoscopy or flexible sigmoidoscopy):  Excessive amounts of blood in the stool  Significant tenderness or worsening of abdominal pains  Swelling of the abdomen that is new, acute  Fever of 100F or higher  FOLLOW UP: If any biopsies were taken you will be contacted by phone or by letter within the next 1-3 weeks.  Call your gastroenterologist if you have not heard about the biopsies in 3 weeks.  Our staff will call the home number listed on your records the next business day following your procedure to check on you and address any questions or concerns that you may have at that time regarding the information given to you following your procedure. This is a courtesy call and so if there is no answer at the home number and we have not heard from you through the emergency physician on call, we will assume that you have returned to your regular daily activities without incident.  SIGNATURES/CONFIDENTIALITY: You and/or your care partner have signed paperwork which will be entered into your electronic medical record.  These signatures attest to the fact that that the information above on your After Visit Summary has been reviewed   and is understood.  Full responsibility of the confidentiality of this discharge information lies with you and/or your care-partner. 

## 2012-12-09 ENCOUNTER — Telehealth: Payer: Self-pay

## 2012-12-09 NOTE — Telephone Encounter (Signed)
Left message on answering machine. 

## 2013-01-16 DIAGNOSIS — I10 Essential (primary) hypertension: Secondary | ICD-10-CM | POA: Diagnosis not present

## 2013-01-22 DIAGNOSIS — E785 Hyperlipidemia, unspecified: Secondary | ICD-10-CM | POA: Diagnosis not present

## 2013-01-22 DIAGNOSIS — I10 Essential (primary) hypertension: Secondary | ICD-10-CM | POA: Diagnosis not present

## 2013-01-22 DIAGNOSIS — Z23 Encounter for immunization: Secondary | ICD-10-CM | POA: Diagnosis not present

## 2013-07-23 DIAGNOSIS — D649 Anemia, unspecified: Secondary | ICD-10-CM | POA: Diagnosis not present

## 2013-07-23 DIAGNOSIS — I10 Essential (primary) hypertension: Secondary | ICD-10-CM | POA: Diagnosis not present

## 2013-07-23 DIAGNOSIS — E7800 Pure hypercholesterolemia, unspecified: Secondary | ICD-10-CM | POA: Diagnosis not present

## 2013-07-29 DIAGNOSIS — K649 Unspecified hemorrhoids: Secondary | ICD-10-CM | POA: Diagnosis not present

## 2013-07-29 DIAGNOSIS — I10 Essential (primary) hypertension: Secondary | ICD-10-CM | POA: Diagnosis not present

## 2013-07-29 DIAGNOSIS — J309 Allergic rhinitis, unspecified: Secondary | ICD-10-CM | POA: Diagnosis not present

## 2013-07-29 DIAGNOSIS — N400 Enlarged prostate without lower urinary tract symptoms: Secondary | ICD-10-CM | POA: Diagnosis not present

## 2013-07-29 DIAGNOSIS — Z0000 Encounter for general adult medical examination without abnormal findings: Secondary | ICD-10-CM | POA: Diagnosis not present

## 2013-07-29 DIAGNOSIS — E785 Hyperlipidemia, unspecified: Secondary | ICD-10-CM | POA: Diagnosis not present

## 2013-11-15 ENCOUNTER — Emergency Department (HOSPITAL_COMMUNITY)
Admission: EM | Admit: 2013-11-15 | Discharge: 2013-11-16 | Disposition: A | Discharge status: Home / Self Care | Payer: Medicare Other | Attending: Emergency Medicine | Admitting: Emergency Medicine

## 2013-11-15 ENCOUNTER — Encounter (HOSPITAL_COMMUNITY): Payer: Self-pay | Admitting: Emergency Medicine

## 2013-11-15 DIAGNOSIS — Z8719 Personal history of other diseases of the digestive system: Secondary | ICD-10-CM | POA: Insufficient documentation

## 2013-11-15 DIAGNOSIS — Z7982 Long term (current) use of aspirin: Secondary | ICD-10-CM | POA: Diagnosis not present

## 2013-11-15 DIAGNOSIS — E785 Hyperlipidemia, unspecified: Secondary | ICD-10-CM | POA: Diagnosis not present

## 2013-11-15 DIAGNOSIS — Z860100 Personal history of colon polyps, unspecified: Secondary | ICD-10-CM | POA: Insufficient documentation

## 2013-11-15 DIAGNOSIS — Z79899 Other long term (current) drug therapy: Secondary | ICD-10-CM | POA: Insufficient documentation

## 2013-11-15 DIAGNOSIS — I8000 Phlebitis and thrombophlebitis of superficial vessels of unspecified lower extremity: Secondary | ICD-10-CM | POA: Insufficient documentation

## 2013-11-15 DIAGNOSIS — I10 Essential (primary) hypertension: Secondary | ICD-10-CM | POA: Insufficient documentation

## 2013-11-15 DIAGNOSIS — Z862 Personal history of diseases of the blood and blood-forming organs and certain disorders involving the immune mechanism: Secondary | ICD-10-CM | POA: Diagnosis not present

## 2013-11-15 DIAGNOSIS — Z87891 Personal history of nicotine dependence: Secondary | ICD-10-CM | POA: Insufficient documentation

## 2013-11-15 DIAGNOSIS — I809 Phlebitis and thrombophlebitis of unspecified site: Secondary | ICD-10-CM

## 2013-11-15 NOTE — ED Notes (Signed)
Pt c/o knot in right upper thigh that formed last night, has bruising to same location. Denies injury to location. Denies taking blood thinners. Pt is ambulatory with no difficulties. Nad, skin warm and dry, resp e/u.

## 2013-11-15 NOTE — ED Provider Notes (Signed)
CSN: [id number]     Arrival date & time 11/15/13  2238 History  This chart was scribed for non-physician provider Zacarias Pontes, PA-C, working with Neta Ehlers, MD by Irene Pap, ED Scribe. This patient was seen in room TR09C/TR09C and patient care was started at 11:28 PM.     Chief Complaint  Patient presents with  . Leg Pain   Patient is a 79 y.o. male presenting with leg pain. The history is provided by the patient. No language interpreter was used.  Leg Pain Location:  Leg Time since incident:  1 day Injury: no   Leg location:  R upper leg Pain details:    Severity:  No pain Chronicity:  New Dislocation: no   Prior injury to area:  No Relieved by:  None tried Worsened by:  Nothing tried Ineffective treatments:  Compression Associated symptoms: no back pain, no decreased ROM, no fever, no muscle weakness, no neck pain, no numbness, no swelling and no tingling    HPI Comments: Adrian Daniels is a 79 y.o. male who presents to the Emergency Department complaining of right upper thigh knot onset one day ago. He states that the area is bruised and sore but does not remember running into anything or getting bit. He denies groin or testicle swelling, red streaking, or testicle abnormalities. He denies any history of similar symptoms. He states that he put a hot towel on the area but to no relief. He takes 325 mg of aspirin once a day. He denies a history of DVT, heart conditions or stroke. He denies CP, SOB, abdominal pain, leg swelling different than his baseline, nausea, vomiting, fever or chills. No recent travel or immobilization.   Past Medical History  Diagnosis Date  . History of colon polyps 05-28-2007    Adenomatous polyp(Colonoscopy with Dr. Lajoyce Corners)  . Hypertension   . Hemorrhoids   . Hyperlipemia   . Anemia, unspecified    Past Surgical History  Procedure Laterality Date  . Unremarkable     Family History  Problem Relation Age of Onset  . Diabetes  Daughter   . Cervical cancer Sister   . Colon cancer Neg Hx   . Colon polyps Neg Hx    History  Substance Use Topics  . Smoking status: Former Research scientist (life sciences)  . Smokeless tobacco: Never Used  . Alcohol Use: 2.0 oz/week    4 drink(s) per week     Comment: 4 per week    Review of Systems  Constitutional: Negative for fever and chills.  Eyes: Negative for visual disturbance.  Respiratory: Negative for cough and shortness of breath.   Cardiovascular: Negative for chest pain, palpitations and leg swelling.  Gastrointestinal: Negative for nausea, vomiting and abdominal pain.  Genitourinary: Negative for dysuria, urgency, hematuria, flank pain, discharge, penile swelling, scrotal swelling, difficulty urinating, penile pain and testicular pain.  Musculoskeletal: Negative for arthralgias, back pain, joint swelling, myalgias, neck pain and neck stiffness.  Skin: Positive for color change. Negative for wound.  Neurological: Negative for weakness, light-headedness, numbness and headaches.   Allergies  Review of patient's allergies indicates no known allergies.  Home Medications   Prior to Admission medications   Medication Sig Start Date End Date Taking? Authorizing Provider  aspirin 325 MG tablet Take 325 mg by mouth daily.    Historical Provider, MD  atorvastatin (LIPITOR) 40 MG tablet Take 40 mg by mouth daily.    Historical Provider, MD  diltiazem (CARDIZEM CD) 300 MG 24 hr capsule  Take 300 mg by mouth daily.    Historical Provider, MD  ibuprofen (ADVIL,MOTRIN) 600 MG tablet Take 1 tablet (600 mg total) by mouth every 8 (eight) hours. For 4 days. TAKE WITH FOOD. 11/16/13   Patty Sermons Camprubi-Soms, PA-C  olmesartan (BENICAR) 40 MG tablet Take 40 mg by mouth daily.    Historical Provider, MD  triamterene-hydrochlorothiazide (DYAZIDE) 37.5-25 MG per capsule Take 1 capsule by mouth every morning.    Historical Provider, MD   BP 151/79  Pulse 83  Temp(Src) 98.3 F (36.8 C) (Oral)  Resp 18   SpO2 98% Physical Exam  Nursing note and vitals reviewed. Constitutional: He is oriented to person, place, and time. Vital signs are normal. He appears well-developed and well-nourished. No distress.  VS WNL  HENT:  Head: Normocephalic and atraumatic.  Mouth/Throat: Mucous membranes are normal.  Eyes: Conjunctivae and EOM are normal.  Neck: Normal range of motion. Neck supple. No spinous process tenderness and no muscular tenderness present. Normal range of motion present.  Cardiovascular: Normal rate, regular rhythm, normal heart sounds and intact distal pulses.  Exam reveals no gallop.   No murmur heard. RRR, no m/r/g, intact distal pulses, cap refill <3sec  Pulmonary/Chest: Effort normal and breath sounds normal. No respiratory distress. He has no decreased breath sounds. He has no wheezes. He has no rhonchi. He has no rales.  CTAB in all lung fields  Abdominal: Soft. Normal appearance and bowel sounds are normal. He exhibits no distension. There is no tenderness. There is no rigidity, no rebound and no guarding.  Musculoskeletal: Normal range of motion.       Legs: FROM intact in all extremities, no TTP in any joints of LEs, strength 5/5 in all extremities. Hematoma located on R thigh at medial aspect, with palpable cord underlying area, non-TTP no warmth or erythema, no swelling.   Neurological: He is alert and oriented to person, place, and time. He has normal strength. No sensory deficit.  Sensation grossly intact, strength 5/5 in all extremities  Skin: Skin is warm, dry and intact. Bruising and ecchymosis noted.     Hematoma approx 5cm in diameter located along medial R thigh, with palpable cord underneath, no erythema or edema, no warmth. No surrounding cellulitis, no fluctuance.  Superficial veins in BLEs appear engorged with palpable cords throughout, all are non-TTP.  1+ pitting edema b/l, which pt states is baseline. Neg homan's  Psychiatric: He has a normal mood and affect.  His behavior is normal.    ED Course  Procedures (including critical care time) DIAGNOSTIC STUDIES: Oxygen Saturation is 98% on room air, normal by my interpretation.    COORDINATION OF CARE: 11:34 PM-Discussed treatment plan which includes warm compresses and blood testing with pt at bedside and pt agreed to plan.   Labs Review Labs Reviewed  PROTIME-INR - Abnormal; Notable for the following:    Prothrombin Time 15.7 (*)    All other components within normal limits  CBC - Abnormal; Notable for the following:    RBC 3.85 (*)    Hemoglobin 11.4 (*)    HCT 33.2 (*)    Platelets 138 (*)    All other components within normal limits  D-DIMER, QUANTITATIVE  I-STAT CHEM 8, ED    Imaging Review No results found.   EKG Interpretation None      MDM   Final diagnoses:  Superficial thrombophlebitis   Adrian Daniels is a 79 y.o. male with hx of HTN, anemia, HLD,  and colon polyps presenting today with R thigh hematoma. Palpable cord with hematoma, non-TTP no cellulitic area or fluctuance. Ddimer and basic labs negative for acute changes (chronic anemia noted on CBC). No need for u/s at this time, I believe this is superficial thrombophlebitis. Doubt PE/DVT or abscess/cellulitis at this time. Denies trauma to area, therefore will not image. Pt with baseline LE pitting edema and notable palpable superficial veins along LEs, which pt states is baseline. Discussed elevation, heat, and ibuprofen, and f/up with PCP. I explained the diagnosis and have given explicit precautions to return to the ER including for any other new or worsening symptoms. The patient understands and accepts the medical plan as it's been dictated and I have answered their questions. Discharge instructions concerning home care and prescriptions have been given. The patient is STABLE and is discharged to home in good condition.  I personally performed the services described in this documentation, which was scribed in my  presence. The recorded information has been reviewed and is accurate.  BP 151/79  Pulse 83  Temp(Src) 98.3 F (36.8 C) (Oral)  Resp 18  SpO2 98%    [employer], PA-C 11/16/13 0126

## 2013-11-16 LAB — CBC
HEMATOCRIT: 33.2 % — AB (ref 39.0–52.0)
Hemoglobin: 11.4 g/dL — ABNORMAL LOW (ref 13.0–17.0)
MCH: 29.6 pg (ref 26.0–34.0)
MCHC: 34.3 g/dL (ref 30.0–36.0)
MCV: 86.2 fL (ref 78.0–100.0)
PLATELETS: 138 10*3/uL — AB (ref 150–400)
RBC: 3.85 MIL/uL — ABNORMAL LOW (ref 4.22–5.81)
RDW: 13 % (ref 11.5–15.5)
WBC: 4 10*3/uL (ref 4.0–10.5)

## 2013-11-16 LAB — PROTIME-INR
INR: 1.25 (ref 0.00–1.49)
Prothrombin Time: 15.7 seconds — ABNORMAL HIGH (ref 11.6–15.2)

## 2013-11-16 LAB — D-DIMER, QUANTITATIVE: D-Dimer, Quant: <0.27 ug/mL-FEU (ref 0.00–0.48)

## 2013-11-16 MED ORDER — IBUPROFEN 600 MG PO TABS: 600.0000 mg | ORAL_TABLET | Freq: Three times a day (TID) | ORAL | Status: DC

## 2013-11-16 NOTE — ED Provider Notes (Signed)
Medical screening examination/treatment/procedure(s) were performed by non-physician practitioner and as supervising physician I was immediately available for consultation/collaboration.   EKG Interpretation None       Adrian Zenz K Ortencia Askari-Rasch, MD 11/16/13 [phone]

## 2013-11-16 NOTE — Discharge Instructions (Signed)
Your leg appears to have a hematoma (bruise) which is probably due to superficial thrombophlebitis. If it gets redder, more swollen, painful and hot to the touch, or you develop fevers/chills, chest pain, or shortness of breath, return to the emergency department immediately. See your regular doctor in 1 week for follow up. Take Ibuprofen as directed for the next 4 days, which should help with the pain and swelling. Also use a warm compress over this area which should help. Keep the leg elevated when you are able to.   Phlebitis Phlebitis is soreness and puffiness (swelling) in a vein.  HOME CARE  Only take medicine as told by your doctor.  Raise (elevate) the affected limb on a pillow as told by your doctor.  Keep a warm pack on the affected vein as told by your doctor. Do not sleep with a heating pad.  Use special stockings or bandages around the area of the affected vein as told by your doctor. These will speed healing and keep the condition from coming back.  Talk to your doctor about all the medicines you take.  Get follow-up blood tests as told by your doctor.  If the phlebitis is in your legs:  Avoid standing or resting for long periods.  Keep your legs moving. Raise your legs when you sit or lie.  Do not smoke.  Follow-up with your doctor as told. GET HELP IF:  You have strange bruises or bleeding.  Your puffiness or pain in the affected area is not getting better.  You are taking medicine to lessen puffiness (anti-inflammatory medicine), and you get belly pain.  You have a fever. GET HELP RIGHT AWAY IF:   The phlebitis gets worse and you have more pain, puffiness (swelling), or redness.  You have trouble breathing or have chest pain. MAKE SURE YOU:   Understand these instructions.  Will watch your condition.  Will get help right away if you are not doing well or get worse. Document Released: 04/19/2009 Document Revised: 05/06/2013 Document Reviewed:  01/06/2013 [facility] Patient Information 2015 [place], [place]. This information is not intended to replace advice given to you by your health care provider. Make sure you discuss any questions you have with your health care provider.

## 2013-11-16 NOTE — ED Notes (Signed)
I-Stat did not cross over to computer.  Paper results shown to Camprubi-Soms, PA.   Na  142 K  3.7 Cl  14 iCa  1.17 TC02  24 Glu  98 BUN  21 Crea  1.5 HCT 36 Hb  12.2

## 2013-11-17 LAB — I-STAT CHEM 8, ED
BUN: 21 mg/dL (ref 6–23)
CREATININE: 1.5 mg/dL — AB (ref 0.50–1.35)
Calcium, Ion: 1.17 mmol/L (ref 1.13–1.30)
Chloride: 104 mEq/L (ref 96–112)
GLUCOSE: 98 mg/dL (ref 70–99)
HEMATOCRIT: 36 % — AB (ref 39.0–52.0)
HEMOGLOBIN: 12.2 g/dL — AB (ref 13.0–17.0)
POTASSIUM: 3.7 meq/L (ref 3.7–5.3)
SODIUM: 142 meq/L (ref 137–147)
TCO2: 24 mmol/L (ref 0–100)

## 2013-11-21 ENCOUNTER — Other Ambulatory Visit: Payer: Self-pay | Admitting: Internal Medicine

## 2013-11-21 DIAGNOSIS — R609 Edema, unspecified: Secondary | ICD-10-CM

## 2013-11-21 DIAGNOSIS — M7989 Other specified soft tissue disorders: Secondary | ICD-10-CM | POA: Diagnosis not present

## 2013-11-25 ENCOUNTER — Ambulatory Visit
Admission: RE | Admit: 2013-11-25 | Discharge: 2013-11-25 | Disposition: A | Discharge status: Home / Self Care | Payer: Medicare Other | Source: Ambulatory Visit | Attending: Internal Medicine | Admitting: Internal Medicine

## 2013-11-25 DIAGNOSIS — M79609 Pain in unspecified limb: Secondary | ICD-10-CM | POA: Diagnosis not present

## 2013-11-25 DIAGNOSIS — R609 Edema, unspecified: Secondary | ICD-10-CM

## 2013-11-26 DIAGNOSIS — R229 Localized swelling, mass and lump, unspecified: Secondary | ICD-10-CM | POA: Diagnosis not present

## 2013-12-24 DIAGNOSIS — Z1331 Encounter for screening for depression: Secondary | ICD-10-CM | POA: Diagnosis not present

## 2013-12-24 DIAGNOSIS — Z0000 Encounter for general adult medical examination without abnormal findings: Secondary | ICD-10-CM | POA: Diagnosis not present

## 2014-02-03 DIAGNOSIS — E785 Hyperlipidemia, unspecified: Secondary | ICD-10-CM | POA: Diagnosis not present

## 2014-02-03 DIAGNOSIS — M7989 Other specified soft tissue disorders: Secondary | ICD-10-CM | POA: Diagnosis not present

## 2014-02-03 DIAGNOSIS — I10 Essential (primary) hypertension: Secondary | ICD-10-CM | POA: Diagnosis not present

## 2014-02-05 ENCOUNTER — Emergency Department (HOSPITAL_COMMUNITY)
Admission: EM | Admit: 2014-02-05 | Discharge: 2014-02-05 | Disposition: A | Discharge status: Home / Self Care | Payer: Medicare Other | Attending: Emergency Medicine | Admitting: Emergency Medicine

## 2014-02-05 ENCOUNTER — Emergency Department (HOSPITAL_COMMUNITY): Payer: Medicare Other

## 2014-02-05 ENCOUNTER — Encounter (HOSPITAL_COMMUNITY): Payer: Self-pay | Admitting: Emergency Medicine

## 2014-02-05 DIAGNOSIS — Z860100 Personal history of colon polyps, unspecified: Secondary | ICD-10-CM | POA: Diagnosis not present

## 2014-02-05 DIAGNOSIS — Z7982 Long term (current) use of aspirin: Secondary | ICD-10-CM | POA: Insufficient documentation

## 2014-02-05 DIAGNOSIS — Z862 Personal history of diseases of the blood and blood-forming organs and certain disorders involving the immune mechanism: Secondary | ICD-10-CM | POA: Diagnosis not present

## 2014-02-05 DIAGNOSIS — S99919A Unspecified injury of unspecified ankle, initial encounter: Secondary | ICD-10-CM

## 2014-02-05 DIAGNOSIS — S99929A Unspecified injury of unspecified foot, initial encounter: Secondary | ICD-10-CM

## 2014-02-05 DIAGNOSIS — Y9289 Other specified places as the place of occurrence of the external cause: Secondary | ICD-10-CM | POA: Diagnosis not present

## 2014-02-05 DIAGNOSIS — Y9389 Activity, other specified: Secondary | ICD-10-CM | POA: Insufficient documentation

## 2014-02-05 DIAGNOSIS — S82899B Other fracture of unspecified lower leg, initial encounter for open fracture type I or II: Secondary | ICD-10-CM | POA: Diagnosis not present

## 2014-02-05 DIAGNOSIS — Z87891 Personal history of nicotine dependence: Secondary | ICD-10-CM | POA: Insufficient documentation

## 2014-02-05 DIAGNOSIS — S82899A Other fracture of unspecified lower leg, initial encounter for closed fracture: Secondary | ICD-10-CM | POA: Insufficient documentation

## 2014-02-05 DIAGNOSIS — E785 Hyperlipidemia, unspecified: Secondary | ICD-10-CM | POA: Diagnosis not present

## 2014-02-05 DIAGNOSIS — W010XXA Fall on same level from slipping, tripping and stumbling without subsequent striking against object, initial encounter: Secondary | ICD-10-CM | POA: Insufficient documentation

## 2014-02-05 DIAGNOSIS — Z79899 Other long term (current) drug therapy: Secondary | ICD-10-CM | POA: Diagnosis not present

## 2014-02-05 DIAGNOSIS — I10 Essential (primary) hypertension: Secondary | ICD-10-CM | POA: Diagnosis not present

## 2014-02-05 DIAGNOSIS — S8990XA Unspecified injury of unspecified lower leg, initial encounter: Secondary | ICD-10-CM | POA: Insufficient documentation

## 2014-02-05 DIAGNOSIS — S82401A Unspecified fracture of shaft of right fibula, initial encounter for closed fracture: Secondary | ICD-10-CM

## 2014-02-05 MED ORDER — TRAMADOL HCL 50 MG PO TABS
50.0000 mg | ORAL_TABLET | Freq: Once | ORAL | Status: AC
  Administered 2014-02-05: 50 mg via ORAL
  Filled 2014-02-05: qty 1

## 2014-02-05 MED ORDER — TRAMADOL HCL 50 MG PO TABS: 50.0000 mg | ORAL_TABLET | Freq: Four times a day (QID) | ORAL | Status: DC | PRN

## 2014-02-05 NOTE — Discharge Instructions (Signed)
Fibular Fracture, Ankle, Adult, Treated With or Without Immobilization °A fibular fracture at your ankle is a break (fracture) bone in the smallest of the two bones in your lower leg, located on the outside of your leg (fibula) close to the area at your ankle joint. °CAUSES °· Rolling your ankle. °· Twisting your ankle. °· Extreme flexing or extending of your foot. °· Severe force on your ankle as when falling from a distance. °RISK FACTORS °· Jumping activities. °· Participation in sports. °· Osteoporosis. °· Advanced age. °· Previous ankle injuries. °SIGNS AND SYMPTOMS °· Pain. °· Swelling. °· Inability to put weight on injured ankle. °· Bruising. °· Bone deformities at site of injury. °DIAGNOSIS  °This fracture is diagnosed with the help of an X-ray exam. °TREATMENT  °If the fractured bone did not move out of place it usually will heal without problems and does casting or splinting. If immobilization is needed for comfort or the fractured bone moved out of place and will not heal properly with immobilization, a cast or splint will be used. °HOME CARE INSTRUCTIONS  °· Apply ice to the area of injury: °¨ Put ice in a plastic bag. °¨ Place a towel between your skin and the bag. °¨ Leave the ice on for 20 minutes, 2-3 times a day. °· Use crutches as directed. Resume walking without crutches as directed by your health care provider. °· Only take over-the-counter or prescription medicines for pain, discomfort, or fever as directed by your health care provider. °· If you have a removable splint or boot, do not remove the boot unless directed by your health care provider. °SEEK MEDICAL CARE IF:  °· You have continued pain or more swelling °· The medications do not control the pain. °SEEK IMMEDIATE MEDICAL CARE IF: °· You develop severe pain in the leg or foot. °· Your skin or nails below the injury turn blue or grey or feel cold or numb. °MAKE SURE YOU:  °· Understand these instructions. °· Will watch your  condition. °· Will get help right away if you are not doing well or get worse. °Document Released: 05/01/2005 Document Revised: 02/19/2013 Document Reviewed: 12/11/2012 °[employer]® Patient Information ©2015 [employer]. This information is not intended to replace advice given to you by your health care provider. Make sure you discuss any questions you have with your health care provider. ° °

## 2014-02-05 NOTE — ED Provider Notes (Signed)
CSN: [id number]     Arrival date & time 02/05/14  0225 History   First MD Initiated Contact with Patient 02/05/14 0355     Chief Complaint  Patient presents with  . Ankle Injury     (Consider location/radiation/quality/duration/timing/severity/associated sxs/prior Treatment) Patient is a 79 y.o. male presenting with lower extremity injury. The history is provided by the patient. No language interpreter was used.  Ankle Injury This is a new problem. The current episode started 6 to 12 hours ago. The problem occurs constantly. The problem has not changed since onset.Pertinent negatives include no chest pain, no abdominal pain, no headaches and no shortness of breath. The symptoms are aggravated by standing. The symptoms are relieved by rest. The treatment provided mild relief.    Past Medical History  Diagnosis Date  . History of colon polyps 05-28-2007    Adenomatous polyp(Colonoscopy with Dr. Lajoyce Corners)  . Hypertension   . Hemorrhoids   . Hyperlipemia   . Anemia, unspecified    Past Surgical History  Procedure Laterality Date  . Unremarkable     Family History  Problem Relation Age of Onset  . Diabetes Daughter   . Cervical cancer Sister   . Colon cancer Neg Hx   . Colon polyps Neg Hx    History  Substance Use Topics  . Smoking status: Former Research scientist (life sciences)  . Smokeless tobacco: Never Used  . Alcohol Use: 2.0 oz/week    4 drink(s) per week     Comment: 4 per week    Review of Systems  Constitutional: Negative for fever, activity change, appetite change and fatigue.  HENT: Negative for congestion, facial swelling, rhinorrhea and trouble swallowing.   Eyes: Negative for photophobia and pain.  Respiratory: Negative for cough, chest tightness and shortness of breath.   Cardiovascular: Negative for chest pain and leg swelling.  Gastrointestinal: Negative for nausea, vomiting, abdominal pain, diarrhea and constipation.  Endocrine: Negative for polydipsia and polyuria.  Genitourinary:  Negative for dysuria, urgency, decreased urine volume and difficulty urinating.  Musculoskeletal: Negative for back pain and gait problem.  Skin: Negative for color change, rash and wound.  Allergic/Immunologic: Negative for immunocompromised state.  Neurological: Negative for dizziness, facial asymmetry, speech difficulty, weakness, numbness and headaches.  Psychiatric/Behavioral: Negative for confusion, decreased concentration and agitation.      Allergies  Review of patient's allergies indicates no known allergies.  Home Medications   Prior to Admission medications   Medication Sig Start Date End Date Taking? Authorizing Provider  aspirin 325 MG tablet Take 325 mg by mouth daily.   Yes Historical Provider, MD  diltiazem (CARDIZEM CD) 300 MG 24 hr capsule Take 300 mg by mouth daily.   Yes Historical Provider, MD  olmesartan (BENICAR) 40 MG tablet Take 40 mg by mouth daily.   Yes Historical Provider, MD  pravastatin (PRAVACHOL) 40 MG tablet Take 40 mg by mouth daily.   Yes Historical Provider, MD  triamterene-hydrochlorothiazide (DYAZIDE) 37.5-25 MG per capsule Take 1 capsule by mouth daily.   Yes Historical Provider, MD  traMADol (ULTRAM) 50 MG tablet Take 1 tablet (50 mg total) by mouth every 6 (six) hours as needed. 02/05/14   Ernestina Patches, MD   BP 144/84  Pulse 86  Temp(Src) 97.6 F (36.4 C) (Oral)  Resp 24  Ht 5\' 11"  (1.803 m)  Wt 195 lb (88.451 kg)  BMI 27.21 kg/m2  SpO2 97% Physical Exam  Constitutional: He is oriented to person, place, and time. He appears well-developed and well-nourished.  No distress.  HENT:  Head: Normocephalic and atraumatic.  Mouth/Throat: No oropharyngeal exudate.  Eyes: Pupils are equal, round, and reactive to light.  Neck: Normal range of motion. Neck supple.  Cardiovascular: Normal rate, regular rhythm and normal heart sounds.  Exam reveals no gallop and no friction rub.   No murmur heard. Pulmonary/Chest: Effort normal and breath sounds  normal. No respiratory distress. He has no wheezes. He has no rales.  Abdominal: Soft. Bowel sounds are normal. He exhibits no distension and no mass. There is no tenderness. There is no rebound and no guarding.  Musculoskeletal: Normal range of motion. He exhibits no edema.       Right ankle: Tenderness. Lateral malleolus tenderness found.  Neurological: He is alert and oriented to person, place, and time.  Skin: Skin is warm and dry.  Psychiatric: He has a normal mood and affect.    ED Course  Procedures (including critical care time) Labs Review Labs Reviewed - No data to display  Imaging Review Dg Ankle Complete Right  02/05/2014   CLINICAL DATA:  [place] last night, right lateral ankle pain and swelling, unable to bear weight  EXAM: RIGHT ANKLE - COMPLETE 3+ VIEW  COMPARISON:  None.  FINDINGS: There is an oblique nondisplaced fracture through the distal fibula extending into the lateral malleolus. No other acute osseous abnormalities identified.  IMPRESSION: Nondisplaced fracture distal fibula.   Electronically Signed   By: Skipper Cliche M.D.   On: 02/05/2014 02:55     EKG Interpretation None      MDM   Final diagnoses:  Right fibular fracture, closed, initial encounter    Pt is a 79 y.o. male with Pmhx as above who presents with right lateral ankle pain after his dog tripped him and he twisted his ankle. X-ray shows a nondisplaced distal fibular fracture. Physical exam is remarkable for tenderness only over the lateral malleolus. He is neurovascularly intact distally and otherwise well-appearing. We'll place in an air cast and will give crutches. Weightbearing as tolerated. Given age we'll have him followup with orthopedics in one week. Tramadol given for pain. Return precautions given for new or worsening symptoms.        Ernestina Patches, MD 02/05/14 0500

## 2014-02-05 NOTE — ED Notes (Signed)
Pt. tripped at his dog's leash and fell yesterday afternoon , no LOC / ambulatory , presents with right ankle pain / swelling , CMS intact .

## 2014-02-10 DIAGNOSIS — I10 Essential (primary) hypertension: Secondary | ICD-10-CM | POA: Diagnosis not present

## 2014-02-10 DIAGNOSIS — N400 Enlarged prostate without lower urinary tract symptoms: Secondary | ICD-10-CM | POA: Diagnosis not present

## 2014-02-10 DIAGNOSIS — D649 Anemia, unspecified: Secondary | ICD-10-CM | POA: Diagnosis not present

## 2014-02-10 DIAGNOSIS — E785 Hyperlipidemia, unspecified: Secondary | ICD-10-CM | POA: Diagnosis not present

## 2014-02-15 ENCOUNTER — Encounter (HOSPITAL_COMMUNITY): Payer: Self-pay | Admitting: Emergency Medicine

## 2014-02-15 ENCOUNTER — Emergency Department (INDEPENDENT_AMBULATORY_CARE_PROVIDER_SITE_OTHER)
Admission: EM | Admit: 2014-02-15 | Discharge: 2014-02-15 | Disposition: A | Discharge status: Home / Self Care | Payer: Medicare Other | Source: Home / Self Care

## 2014-02-15 DIAGNOSIS — S82841D Displaced bimalleolar fracture of right lower leg, subsequent encounter for closed fracture with routine healing: Secondary | ICD-10-CM

## 2014-02-15 DIAGNOSIS — S82891A Other fracture of right lower leg, initial encounter for closed fracture: Secondary | ICD-10-CM | POA: Diagnosis not present

## 2014-02-15 NOTE — ED Provider Notes (Signed)
CSN: [id number]     Arrival date & time 02/15/14  1722 History   None    Chief Complaint  Adrian Daniels presents with  . Ankle Pain   (Consider location/radiation/quality/duration/timing/severity/associated sxs/prior Treatment) HPI Comments: Adrian Daniels had a mechanical trip and fall on 02/05/2014 and suffered a fracture to right distal fibula. Was placed in CAM walker and advised to follow up with orthopedist (Dr. Fermin Schwab).  Upon speaking with the Adrian Daniels, he explains to me that he was told by ER staff that his ankle was fractured. When I explained to Adrian Daniels that this meant his ankle was broken, he stated that he did not realized that fractured was simply another term used to mean that a bone was broken.  He has not followed up with orthopedics because he simply did not understand his injury. Mentions as an aside that he has noticed some ecchymotic discoloration of his right 2nd, 3rd, 4th toes without associated pain or paresthesia.    Adrian Daniels is a 79 y.o. male presenting with ankle pain. The history is provided by the Adrian Daniels.  Ankle Pain   Past Medical History  Diagnosis Date  . History of colon polyps 05-28-2007    Adenomatous polyp(Colonoscopy with Dr. Lajoyce Corners)  . Hypertension   . Hemorrhoids   . Hyperlipemia   . Anemia, unspecified    Past Surgical History  Procedure Laterality Date  . Unremarkable     Family History  Problem Relation Age of Onset  . Diabetes Daughter   . Cervical cancer Sister   . Colon cancer Neg Hx   . Colon polyps Neg Hx    History  Substance Use Topics  . Smoking status: Former Research scientist (life sciences)  . Smokeless tobacco: Never Used  . Alcohol Use: 2.0 oz/week    4 drink(s) per week     Comment: 4 per week    Review of Systems  All other systems reviewed and are negative.   Allergies  Review of Adrian Daniels's allergies indicates no known allergies.  Home Medications   Prior to Admission medications   Medication Sig Start Date End Date Taking? Authorizing Provider   aspirin 325 MG tablet Take 325 mg by mouth daily.    Historical Provider, MD  diltiazem (CARDIZEM CD) 300 MG 24 hr capsule Take 300 mg by mouth daily.    Historical Provider, MD  olmesartan (BENICAR) 40 MG tablet Take 40 mg by mouth daily.    Historical Provider, MD  pravastatin (PRAVACHOL) 40 MG tablet Take 40 mg by mouth daily.    Historical Provider, MD  traMADol (ULTRAM) 50 MG tablet Take 1 tablet (50 mg total) by mouth every 6 (six) hours as needed. 02/05/14   Ernestina Patches, MD  triamterene-hydrochlorothiazide (DYAZIDE) 37.5-25 MG per capsule Take 1 capsule by mouth daily.    Historical Provider, MD   BP 154/93  Pulse 96  Temp(Src) 98.2 F (36.8 C) (Oral)  Resp 18  SpO2 100% Physical Exam  Nursing note and vitals reviewed. Constitutional: He is oriented to person, place, and time. He appears well-developed and well-nourished.  HENT:  Head: Normocephalic and atraumatic.  Cardiovascular: Normal rate.   Pulmonary/Chest: Effort normal.  Musculoskeletal:       Right ankle: He exhibits swelling and ecchymosis. He exhibits no deformity and no laceration. Tenderness. Lateral malleolus tenderness found. Achilles tendon normal.  Neurological: He is alert and oriented to person, place, and time.  Skin: Skin is warm and dry.  +mild dependent ecchymosis of right 2nd, 3rd, and 4th toes  with intact ROM, cap refill and sensation.   Psychiatric: He has a normal mood and affect. His behavior is normal.    ED Course  Procedures (including critical care time) Labs Review Labs Reviewed - No data to display  Imaging Review No results found.   MDM   1. Pott's fracture (of distal fibula), right, closed, with routine healing, subsequent encounter   We talked about his injury and he now understands the importance of orthopedic follow up and states he will contact Dr. Bettina Gavia office tomorrow to arrange a follow up appointment. Continue CAM walker during day. Ice and elevate to reduce swelling.      [place], [place] 02/16/14 [phone]

## 2014-02-15 NOTE — Discharge Instructions (Signed)
Please wear brace as directed during the day and elevate right leg as much as possible to minimize swelling. Call Dr. Tamera Punt tomorrow to arrange for a follow up appointment.

## 2014-02-15 NOTE — ED Notes (Signed)
Patient seen in [facility] ed for a broken ankle on 9/24.  Patient has returned to ucc today for concerns related to swelling and discoloration of toes.  Ankle is swollen, pedal pulses palpable/faint.  Foot warm to touch.  Bruising at all toes.  Able to move toes and toes feel normal to patient per patient.

## 2014-02-17 NOTE — ED Provider Notes (Signed)
Medical screening examination/treatment/procedure(s) were performed by resident physician or non-physician practitioner and as supervising physician I was immediately available for consultation/collaboration.   Pauline Good MD.   Billy Fischer, MD 02/17/14 [phone]

## 2014-02-20 DIAGNOSIS — S82891A Other fracture of right lower leg, initial encounter for closed fracture: Secondary | ICD-10-CM | POA: Diagnosis not present

## 2014-03-06 DIAGNOSIS — S82891D Other fracture of right lower leg, subsequent encounter for closed fracture with routine healing: Secondary | ICD-10-CM | POA: Diagnosis not present

## 2014-03-06 DIAGNOSIS — S82831D Other fracture of upper and lower end of right fibula, subsequent encounter for closed fracture with routine healing: Secondary | ICD-10-CM | POA: Diagnosis not present

## 2014-03-23 DIAGNOSIS — Z23 Encounter for immunization: Secondary | ICD-10-CM | POA: Diagnosis not present

## 2014-05-14 DIAGNOSIS — I10 Essential (primary) hypertension: Secondary | ICD-10-CM | POA: Diagnosis not present

## 2014-05-14 DIAGNOSIS — D649 Anemia, unspecified: Secondary | ICD-10-CM | POA: Diagnosis not present

## 2014-05-14 DIAGNOSIS — E785 Hyperlipidemia, unspecified: Secondary | ICD-10-CM | POA: Diagnosis not present

## 2014-05-15 HISTORY — PX: HEMORRHOID BANDING: SHX5850

## 2014-05-19 DIAGNOSIS — Z23 Encounter for immunization: Secondary | ICD-10-CM | POA: Diagnosis not present

## 2014-05-19 DIAGNOSIS — E780 Pure hypercholesterolemia: Secondary | ICD-10-CM | POA: Diagnosis not present

## 2014-05-19 DIAGNOSIS — K649 Unspecified hemorrhoids: Secondary | ICD-10-CM | POA: Diagnosis not present

## 2014-05-19 DIAGNOSIS — I10 Essential (primary) hypertension: Secondary | ICD-10-CM | POA: Diagnosis not present

## 2014-12-01 DIAGNOSIS — I4891 Unspecified atrial fibrillation: Secondary | ICD-10-CM | POA: Diagnosis not present

## 2014-12-01 DIAGNOSIS — N401 Enlarged prostate with lower urinary tract symptoms: Secondary | ICD-10-CM | POA: Diagnosis not present

## 2014-12-01 DIAGNOSIS — E780 Pure hypercholesterolemia: Secondary | ICD-10-CM | POA: Diagnosis not present

## 2014-12-01 DIAGNOSIS — Z125 Encounter for screening for malignant neoplasm of prostate: Secondary | ICD-10-CM | POA: Diagnosis not present

## 2014-12-01 DIAGNOSIS — I10 Essential (primary) hypertension: Secondary | ICD-10-CM | POA: Diagnosis not present

## 2014-12-02 DIAGNOSIS — D649 Anemia, unspecified: Secondary | ICD-10-CM | POA: Diagnosis not present

## 2014-12-08 DIAGNOSIS — K625 Hemorrhage of anus and rectum: Secondary | ICD-10-CM | POA: Diagnosis not present

## 2014-12-08 DIAGNOSIS — I4891 Unspecified atrial fibrillation: Secondary | ICD-10-CM | POA: Diagnosis not present

## 2014-12-09 ENCOUNTER — Ambulatory Visit (INDEPENDENT_AMBULATORY_CARE_PROVIDER_SITE_OTHER): Payer: Medicare Other | Admitting: Nurse Practitioner

## 2014-12-09 ENCOUNTER — Ambulatory Visit (INDEPENDENT_AMBULATORY_CARE_PROVIDER_SITE_OTHER): Payer: Medicare Other | Admitting: Internal Medicine

## 2014-12-09 ENCOUNTER — Encounter: Payer: Self-pay | Admitting: Internal Medicine

## 2014-12-09 ENCOUNTER — Encounter: Payer: Self-pay | Admitting: Nurse Practitioner

## 2014-12-09 VITALS — BP 164/100 | HR 92 | Ht 71.0 in | Wt 192.4 lb

## 2014-12-09 DIAGNOSIS — K648 Other hemorrhoids: Secondary | ICD-10-CM | POA: Diagnosis not present

## 2014-12-09 DIAGNOSIS — I4891 Unspecified atrial fibrillation: Secondary | ICD-10-CM | POA: Insufficient documentation

## 2014-12-09 HISTORY — DX: Other hemorrhoids: K64.8

## 2014-12-09 NOTE — Patient Instructions (Addendum)
Please remain off of your Savaysa until all hemorrhoidal banding procedures have been completed.  You may restart your aspirin 325 mg in 2 days if your bleeding has stopped.  You have been scheduled for your 2nd hemorrhoidal banding on Thursday, 12/24/14 @ 2:15 pm.  HEMORRHOID BANDING PROCEDURE    FOLLOW-UP CARE   1. The procedure you have had should have been relatively painless since the banding of the area involved does not have nerve endings and there is no pain sensation.  The rubber band cuts off the blood supply to the hemorrhoid and the band may fall off as soon as 48 hours after the banding (the band may occasionally be seen in the toilet bowl following a bowel movement). You may notice a temporary feeling of fullness in the rectum which should respond adequately to plain Tylenol or Motrin.  2. Following the banding, avoid strenuous exercise that evening and resume full activity the next day.  A sitz bath (soaking in a warm tub) or bidet is soothing, and can be useful for cleansing the area after bowel movements.     3. To avoid constipation, take two tablespoons of natural wheat bran, natural oat bran, flax, Benefiber or any over the counter fiber supplement and increase your water intake to 7-8 glasses daily.    4. Unless you have been prescribed anorectal medication, do not put anything inside your rectum for two weeks: No suppositories, enemas, fingers, etc.  5. Occasionally, you may have more bleeding than usual after the banding procedure.  This is often from the untreated hemorrhoids rather than the treated one.  Don't be concerned if there is a tablespoon or so of blood.  If there is more blood than this, lie flat with your bottom higher than your head and apply an ice pack to the area. If the bleeding does not stop within a half an hour or if you feel faint, call our office at [phone] or go to the emergency room.  6. Problems are not common; however, if there is a  substantial amount of bleeding, severe pain, chills, fever or difficulty passing urine (very rare) or other problems, you should call us at [phone] or report to the nearest emergency room.  7. Do not stay seated continuously for more than 2-3 hours for a day or two after the procedure.  Tighten your buttock muscles 10-15 times every two hours and take 10-15 deep breaths every 1-2 hours.  Do not spend more than a few minutes on the toilet if you cannot empty your bowel; instead re-visit the toilet at a later time.

## 2014-12-09 NOTE — Assessment & Plan Note (Signed)
LL banded - f/u 8/11 possible repeat banding May start ASA in 2 days if ok Stay off full anticoagulation til I see him again 8/11 if possible.

## 2014-12-09 NOTE — Progress Notes (Signed)
    HPI :  Patient is a 80 year old male known to Dr. Sharlett Iles for history of adenomatous colon polyps though none found on last colonoscopy July 2014.   Patient here for hemorrhoidal bleeding. He has known internal and external hemorrhoids. He has chronic intermittent bleeding but since starting Savaysa a week ago the bleeding has increased significantly. No associated rectal pain or abdominal pain. He denies constipation. He has used hemorrhoidal creams as needed, currently using Anusol HC  Current Medications, Allergies, Past Medical History, Past Surgical History, Family History and Social History were reviewed in [employer] record.  Past Medical History  Diagnosis Date  . History of colon polyps 05-28-2007    Adenomatous polyp(Colonoscopy with Dr. Lajoyce Corners)  . Hypertension   . Hemorrhoids   . Hyperlipemia   . Anemia, unspecified   . Atrial fibrillation     Family History  Problem Relation Age of Onset  . Diabetes Daughter   . Cervical cancer Sister   . Colon cancer Neg Hx   . Colon polyps Neg Hx    History  Substance Use Topics  . Smoking status: Former Research scientist (life sciences)  . Smokeless tobacco: Never Used  . Alcohol Use: 2.0 oz/week    4 drink(s) per week     Comment: 4 per week   Current Outpatient Prescriptions  Medication Sig Dispense Refill  . aspirin 325 MG tablet Take 325 mg by mouth daily.    Marland Kitchen diltiazem (CARDIZEM CD) 300 MG 24 hr capsule Take 300 mg by mouth daily.    . hydrocortisone (ANUSOL-HC) 2.5 % rectal cream Place 1 application rectally daily as needed for hemorrhoids or itching.    . olmesartan (BENICAR) 40 MG tablet Take 40 mg by mouth daily.    . pravastatin (PRAVACHOL) 40 MG tablet Take 40 mg by mouth daily.    . traMADol (ULTRAM) 50 MG tablet Take 1 tablet (50 mg total) by mouth every 6 (six) hours as needed. 20 tablet 0  . triamterene-hydrochlorothiazide (DYAZIDE) 37.5-25 MG per capsule Take 1 capsule by mouth daily.     No current  facility-administered medications for this visit.   No Known Allergies   Review of Systems: All systems reviewed and negative except where noted in HPI.   Physical Exam: BP 164/100 mmHg  Pulse 92  Ht 5\' 11"  (1.803 m)  Wt 192 lb 6.4 oz (87.272 kg)  BMI 26.85 kg/m2 Constitutional: Pleasant,well-developed, black male. Cardiovascular: Irregular rhythm.   Pulmonary/chest: Effort normal and breath sounds normal. No wheezing, rales or rhonchi. Rectal: protruding internal hemorrhoid which is reducible. Blood on underwear Neurological: Alert and oriented to person place and time. Skin: Skin is warm and dry. No rashes noted. Psychiatric: Normal mood and affect. Behavior is normal.   ASSESSMENT AND PLAN:  80 year old male with chronic painless rectal bleeding from hemorrhoids. Bleeding recently increased after starting Savaysa. Patient could benefit from hemorrhoidal banding, especially since he has new onset atrial fibrillation requiring anticoagulant type medication. [facility] has been on hold for two days because of the bleeding.  Dr. Carlean Purl will see patient this am, discuss the procedure and proceed if patient agreeable.

## 2014-12-09 NOTE — Progress Notes (Signed)
   See NP note - has had sig rectal bleeding after starting xa inhibitor for new dx Afib Denies constipation, diarrhea, straining to stool. Has brown stools w/ bleeding. + prolapse sxs and prolapsed Gr 3 LL internal hemorrhoid on exam   Anoscopy was performed with the patient in the left lateral decubitus position while a chaperone was present and revealed Gr 3 LL and Gr 1-2 RA/RP internal hemorrhoids  PROCEDURE NOTE: The patient presents with symptomatic grade 1-3  hemorrhoids, requesting rubber band ligation of his/her hemorrhoidal disease.  All risks, benefits and alternative forms of therapy were described and informed consent was obtained.   The anorectum was pre-medicated with 0.125% NTG The decision was made to band the LL internal hemorrhoid, and the [place] was used to perform band ligation without complication.  Digital anorectal examination was then performed to assure proper positioning of the band, and to adjust the banded tissue as required.  The patient was discharged home without pain or other issues.  Dietary and behavioral recommendations were given and along with follow-up instructions.     The following adjunctive treatments were recommended:  Hold anticoagulants ASA ok in 2 d if bleeding greatly reduced/gone  The patient will return 12/24/2014 for  follow-up and possible additional banding as required. No complications were encountered and the patient tolerated the procedure well.    Internal hemorrhoids, bleeding and Grade 3 prolapse LL banded - f/u 8/11 possible repeat banding May start ASA in 2 days if ok Stay off full anticoagulation til I see him again 8/11 if possible.   Hx of adenomatous colonic polyps Looks like last colonoscopy 2009 - he is 56 but w. Hx adenomas x 3 exams would consider one more colonoscopy Will discuss    I appreciate the opportunity to care for this patient. OZ:[id number],NOIBBC DAVIDSON, MD

## 2014-12-09 NOTE — Assessment & Plan Note (Signed)
Looks like last colonoscopy 2009 - he is 55 but w. Hx adenomas x 3 exams would consider one more colonoscopy Will discuss

## 2014-12-10 DIAGNOSIS — K648 Other hemorrhoids: Secondary | ICD-10-CM | POA: Diagnosis not present

## 2014-12-10 DIAGNOSIS — K625 Hemorrhage of anus and rectum: Secondary | ICD-10-CM | POA: Diagnosis not present

## 2014-12-10 DIAGNOSIS — I4891 Unspecified atrial fibrillation: Secondary | ICD-10-CM | POA: Diagnosis not present

## 2014-12-10 DIAGNOSIS — I10 Essential (primary) hypertension: Secondary | ICD-10-CM | POA: Diagnosis not present

## 2014-12-17 NOTE — Progress Notes (Signed)
Agree with Ms. Guenther's assessment and plan. Ralph Benavidez E. Burwell Bethel, MD, FACG   

## 2014-12-23 DIAGNOSIS — I4891 Unspecified atrial fibrillation: Secondary | ICD-10-CM | POA: Diagnosis not present

## 2014-12-23 DIAGNOSIS — Z8673 Personal history of transient ischemic attack (TIA), and cerebral infarction without residual deficits: Secondary | ICD-10-CM | POA: Diagnosis not present

## 2014-12-23 DIAGNOSIS — I739 Peripheral vascular disease, unspecified: Secondary | ICD-10-CM | POA: Diagnosis not present

## 2014-12-23 DIAGNOSIS — I10 Essential (primary) hypertension: Secondary | ICD-10-CM | POA: Diagnosis not present

## 2014-12-24 ENCOUNTER — Encounter: Payer: Self-pay | Admitting: Internal Medicine

## 2014-12-24 ENCOUNTER — Ambulatory Visit (INDEPENDENT_AMBULATORY_CARE_PROVIDER_SITE_OTHER): Payer: Medicare Other | Admitting: Internal Medicine

## 2014-12-24 VITALS — BP 138/84 | HR 80 | Ht 71.0 in | Wt 191.2 lb

## 2014-12-24 DIAGNOSIS — K648 Other hemorrhoids: Secondary | ICD-10-CM | POA: Diagnosis not present

## 2014-12-24 DIAGNOSIS — I4891 Unspecified atrial fibrillation: Secondary | ICD-10-CM | POA: Diagnosis not present

## 2014-12-24 DIAGNOSIS — I10 Essential (primary) hypertension: Secondary | ICD-10-CM | POA: Diagnosis not present

## 2014-12-24 NOTE — Patient Instructions (Addendum)
   HEMORRHOID BANDING PROCEDURE    FOLLOW-UP CARE   1. The procedure you have had should have been relatively painless since the banding of the area involved does not have nerve endings and there is no pain sensation.  The rubber band cuts off the blood supply to the hemorrhoid and the band may fall off as soon as 48 hours after the banding (the band may occasionally be seen in the toilet bowl following a bowel movement). You may notice a temporary feeling of fullness in the rectum which should respond adequately to plain Tylenol or Motrin.  2. Following the banding, avoid strenuous exercise that evening and resume full activity the next day.  A sitz bath (soaking in a warm tub) or bidet is soothing, and can be useful for cleansing the area after bowel movements.     3. To avoid constipation, take two tablespoons of natural wheat bran, natural oat bran, flax, Benefiber or any over the counter fiber supplement and increase your water intake to 7-8 glasses daily.    4. Unless you have been prescribed anorectal medication, do not put anything inside your rectum for two weeks: No suppositories, enemas, fingers, etc.  5. Occasionally, you may have more bleeding than usual after the banding procedure.  This is often from the untreated hemorrhoids rather than the treated one.  Don't be concerned if there is a tablespoon or so of blood.  If there is more blood than this, lie flat with your bottom higher than your head and apply an ice pack to the area. If the bleeding does not stop within a half an hour or if you feel faint, call our office at [phone] or go to the emergency room.  6. Problems are not common; however, if there is a substantial amount of bleeding, severe pain, chills, fever or difficulty passing urine (very rare) or other problems, you should call us at [phone] or report to the nearest emergency room.  7. Do not stay seated continuously for more than 2-3 hours for a  day or two after the procedure.  Tighten your buttock muscles 10-15 times every two hours and take 10-15 deep breaths every 1-2 hours.  Do not spend more than a few minutes on the toilet if you cannot empty your bowel; instead re-visit the toilet at a later time.    Start Savaysa and stop Aspirin on 01/04/15  I appreciate the opportunity to care for you.    -

## 2014-12-24 NOTE — Assessment & Plan Note (Signed)
RA and RP banded

## 2014-12-26 NOTE — Progress Notes (Signed)
   PROCEDURE NOTE: The patient presents with symptomatic grade 2-3 hemorrhoids, requesting rubber band ligation of his/her hemorrhoidal disease.  All risks, benefits and alternative forms of therapy were described and informed consent was obtained.   The anorectum was pre-medicated with 0.125% NTG and 5% liodcaine The decision was made to band the RA and RP internal hemorrhoids, and the [place] was used to perform band ligation without complication.  Digital anorectal examination was then performed to assure proper positioning of the band, and to adjust the banded tissue as required.  The patient was discharged home without pain or other issues.  Dietary and behavioral recommendations were given and along with follow-up instructions.       The patient will return as needed for  follow-up and possible additional banding as required. No complications were encountered and the patient tolerated the procedure well.  He will resume anti-coagulation in 10 d.  TW:[id number],MNOTRR DAVIDSON, MD

## 2014-12-28 DIAGNOSIS — I739 Peripheral vascular disease, unspecified: Secondary | ICD-10-CM | POA: Diagnosis not present

## 2014-12-28 DIAGNOSIS — I4891 Unspecified atrial fibrillation: Secondary | ICD-10-CM | POA: Diagnosis not present

## 2014-12-28 DIAGNOSIS — I10 Essential (primary) hypertension: Secondary | ICD-10-CM | POA: Diagnosis not present

## 2015-01-08 ENCOUNTER — Encounter: Payer: PRIVATE HEALTH INSURANCE | Admitting: Internal Medicine

## 2015-01-08 DIAGNOSIS — I739 Peripheral vascular disease, unspecified: Secondary | ICD-10-CM | POA: Diagnosis not present

## 2015-01-08 DIAGNOSIS — I4891 Unspecified atrial fibrillation: Secondary | ICD-10-CM | POA: Diagnosis not present

## 2015-01-08 DIAGNOSIS — I10 Essential (primary) hypertension: Secondary | ICD-10-CM | POA: Diagnosis not present

## 2015-01-08 DIAGNOSIS — Z7901 Long term (current) use of anticoagulants: Secondary | ICD-10-CM | POA: Diagnosis not present

## 2015-01-27 DIAGNOSIS — I739 Peripheral vascular disease, unspecified: Secondary | ICD-10-CM | POA: Diagnosis not present

## 2015-01-28 DIAGNOSIS — I4891 Unspecified atrial fibrillation: Secondary | ICD-10-CM | POA: Diagnosis not present

## 2015-02-01 NOTE — H&P (Addendum)
OFFICE VISIT NOTES COPIED TO EPIC FOR DOCUMENTATION  Adrian Daniels. Nodal 01/24/15 8:59 AM Location: [facility] Patient #: [id number] DOB: Jan 25, 1935 Divorced / Language: [place] / Race: Black or African American Male   History of Present Illness Adrian Daniels; 01/24/2015 10:07 AM) Patient words: F/U for echo and nuc test results.  The patient is a 80 year old male who presents for a follow-up for Atrial fibrillation. He had a routine physical examination on 12/01/2014 was found to have asymptomatic atrial fibrillation. Patient denies any shortness breath or chest pain no leg edema, painful swelling of lower extremities.  Patient was started on Savaysa, however was recently discovered due to to hemorrhoids for which he underwent banding and now on anticoagulation as of 01/04/2015. Tolerating this without bleeding complications.  Additional reasons for visit:  Follow-up for Leg claudication is described as the following: His main complaint today in the office was bilateral calf pain, states that this has affected his lifestyle and he has to rest fairly frequently. Some days he is able to perform much more than others. He has not noticed any bluish discoloration or ulceration in the feet. States that both feet hurt especially in the calf. Pain comes on with activity and is relieved with rest. No new symptoms since last OV.   Problem List/Past Medical Adrian Daniels; 2015/01/24 9:30 AM) Benign essential HTN (I10) Labs 12/01/2014: HB 11.9/HCT 34.4 with normocytic indices, PLT 109, creatinine 1.2, potassium 3.5, CMP otherwise normal, TSH 4.09, total cholesterol 154, triglycerides 48, HDL 54, LDL 90 BMP 11/16/2013: BUN 21, serum creatinine 1.50, hemoglobin 12.2/hematocrit 36.0. Claudication, intermittent (I73.9) BPH (benign prostatic hyperplasia) (N40.0) History of TIA (transient ischemic attack) (Z86.73)05/31/2005 Sudden onset left sided numbness, resolved on arrival to  hospital. Admitted for CVA eval with no signfiicant findings in 2007 Atrial fibrillation, new onset (I48.91)12/01/2014 CHA2DS2-VASc Score is 6 with yearly risk of stroke of 9.8%. HAS-Bled score is 2 and estimated major bleeding in one year is 1.8-2.3% Internal hemorrhoids without complication (E15.8)30/94/0768 Follows Dr. Carlean Purl. S/P Banding 12/09/2014  Allergies (Charavina Daniels; 01-24-15 9:30 AM) No Known Drug Allergies08/01/2015  Family History Adrian Daniels; 24-Jan-2015 9:30 AM) Mother Deceased. at age 63 from MI; patient believes that she may have had a stroke but is unsure; no known cardiovascular conditions Brother 1 Deceased. at 31 years old from [place]; 10 yrs older; no other known cardiovascular conditions Father Deceased. at age 29 from natural causes; no heart attacks or strokes, no cardiovascular conditions Siblings Patient has a total of 8 siblings; Most of whom are listed had some sort of cardiovascular condition, but most of them are stable. Brother 2 In stable health. 6 yrs older; pacemaker; no other cardiovascular conditions Brother 3 Deceased. at age 57 from CVA; no other cardiovascular conditions  Social History Adrian Daniels; Jan 24, 2015 9:30 AM) Current tobacco use Former smoker. quit 25 yrs ago Alcohol Use Occasional alcohol use. Number of Children 8. Living Situation Lives alone. Marital status Divorced.  Past Surgical History Adrian Daniels; 01-24-2015 9:30 AM) Hemorrhoidectomy07/25/2016  Medication History (Charavina Daniels; Jan 24, 2015 9:35 AM) Valsartan-Hydrochlorothiazide (320-12.5MG Tablet, 1 Oral daily) Active. Pravastatin Sodium (80MG Tablet, 1 Oral daily) Active. Diltiazem HCl ER Beads (300MG Capsule ER 24HR, 1 Oral daily) Active. Hydrocodone-Acetaminophen (10-325MG Tablet, 1 Oral as needed for pain) Active. Tamsulosin HCl (0.4MG Capsule, 1 Oral daily) Active. Proctosol HC (2.5% Cream, apply Rectal as needed) Active. Viagra  (100MG Tablet, 1 Oral as needed) Active. Clarinex (5MG Tablet, 1 Oral as needed) Active. Flonase (  50MCG/ACT Suspension, 1 spray each nostril Nasal as needed) Active. Savaysa (60MG Tablet, 1 Oral daily) Active. Medications Reconciled  Diagnostic Studies History ([name]; 2015-01-13 9:07 AM) Echocardiogram08/03/2015 1. Left ventricle cavity is normal in size. Moderate concentric hypertrophy of the left ventricle. Mild decrease in global wall motion. Left ventricle regional wall motion findings: No wall motion abnormalities. Unable to evaluate diastolic function due to A. Fibrillation. Visual EF is 40-45%. Calculated EF 58%. 2. Left atrial cavity is mildly dilated. 3. Mild aortic valve leaflet calcification. Mildly restricted aortic valve leaflets. No evidence of aortic valve stenosis. Mild aortic regurgitation. 4. Mild mitral regurgitation. Mild tricuspid regurgitation. No evidence of pulmonary hypertension. Mild pulmonic regurgitation. 5. The aortic root is normal. Mildly dilated ascending aorta. 6. Compared to the study done on 06/05/2012, EF decreased from normal. A. Fibrillation is new. Nuclear stress test08/15/2016 1. The resting electrocardiogram demonstrated atrial fibrillation and normal resting conduction. Stress EKG was negative for myocardial ischemia, there was no ST-T wave changes of ischemia. However at 1 minute into recovery patient had 3 beat run of wide complex tachycardia probably A. fib with aberrancy but cannot exclude NSVT. Patient exercised for 5 minutes and 28 seconds and achieved 7.05 METS, achieving 100% of age-predicted MPHR, stress terminated due to fatigue and achieving MPHR. 2. There is mild diaphragmatic attenuation artifact in the inferior wall and mild apical thinning but no definite evidence of ischemia. Left ventricle systolic function was calculated at 43% with mild global hypokinesis. LVEF calculation may be compromised due to difficulty with gating in A.  Fibrillation. This is a low risk study.    Review of Systems Adrian Daniels; 01/13/15 10:16 AM) General Present- Feeling well. Not Present- Fatigue, Fever and Night Sweats. Skin Not Present- Itching and Rash. HEENT Not Present- Headache. Respiratory Not Present- Difficulty Breathing. Cardiovascular Present- Claudications. Not Present- Fainting, Orthopnea and Swelling of Extremities. Gastrointestinal Present- Constipation. Not Present- Abdominal Pain, Black, Tarry Stool, Bloody Stool, Diarrhea, Nausea and Vomiting. Musculoskeletal Not Present- Joint Swelling. Neurological Not Present- Headaches. Hematology Not Present- Blood Clots, Easy Bruising and Nose Bleed.  Vitals Adrian Daniels; 2015-01-13 9:38 AM) Jan 13, 2015 9:31 AM Weight: 191 lb Height: 71in Body Surface Area: 2.07 m Body Mass Index: 26.64 kg/m  Pulse: 91 (Regular)  P.OX: 98% (Room air) BP: 138/82 (Sitting, Left Arm, Standard)       Physical Exam Adrian Page, Daniels; 01-13-2015 10:16 AM) General Mental Status-Alert. General Appearance-Cooperative, Appears stated age, Not in acute distress. Orientation-Oriented X3. Build & Nutrition-Well built.  Head and Neck Thyroid Gland Characteristics - no palpable nodules, no palpable enlargement.  Chest and Lung Exam Palpation Tender - No chest wall tenderness. Auscultation Breath sounds - Clear.  Cardiovascular Inspection Jugular vein - Right - No Distention. Auscultation Heart Sounds - S1 is variable, S2 WNL and No gallop present. Murmurs & Other Heart Sounds - Murmur - No murmur.  Abdomen Palpation/Percussion Normal exam - Non Tender and No hepatosplenomegaly. Auscultation Normal exam - Bowel sounds normal.  Peripheral Vascular Lower Extremity Inspection - Left - No Pigmentation, No Varicose veins. Right - No Pigmentation, No Varicose veins. Palpation - Edema - Bilateral - No edema. Femoral pulse - Bilateral - Normal. Popliteal  pulse - Left - Normal. Right - Feeble. Dorsalis pedis pulse - Left - Normal. Right - Absent. Posterior tibial pulse - Left - Normal. Right - Absent. Carotid arteries - Bilateral-No Carotid bruit. Abdomen-No prominent abdominal aortic pulsation, No epigastric bruit.  Neurologic Motor-Grossly intact without any  focal deficits.  Musculoskeletal Global Assessment Left Lower Extremity - normal range of motion without pain. Right Lower Extremity - normal range of motion without pain.    Assessment & Plan Adrian Daniels; 01/08/2015 5:50 PM) Atrial fibrillation, new onset (I48.91) Story: CHA2DS2-VASc Score is 6 with yearly risk of stroke of 9.8%. HAS-Bled score is 2 and estimated major bleeding in one year is 1.8-2.3%  Echocardiogram 12/24/2014: 1. Left ventricle cavity is normal in size. Moderate concentric hypertrophy of the left ventricle. Mild decrease in global wall motion. Left ventricle regional wall motion findings: No wall motion abnormalities. Unable to evaluate diastolic function due to A. Fibrillation. Visual EF is 40-45%. Calculated EF 58%. 2. Left atrial cavity is mildly dilated. 3. Mild aortic valve leaflet calcification. Mildly restricted aortic valve leaflets. No evidence of aortic valve stenosis. Mild aortic regurgitation. 4. Mild mitral regurgitation. Mild tricuspid regurgitation. No evidence of pulmonary hypertension. Mild pulmonic regurgitation. 5. The aortic root is normal. Mildly dilated ascending aorta. 6. Compared to the study done on 06/05/2012, EF decreased from normal. A. Fibrillation is new.  Exercise myoview stress 12/28/2014: 1. The resting electrocardiogram demonstrated atrial fibrillation and normal resting conduction. Stress EKG was negative for myocardial ischemia, there was no ST-T wave changes of ischemia. However at 1 minute into recovery patient had 3 beat run of wide complex tachycardia probably A. fib with aberrancy but cannot exclude NSVT.  Patient exercised for 5 minutes and 28 seconds and achieved 7.05 METS, achieving 100% of age-predicted MPHR, stress terminated due to fatigue and achieving MPHR. 2. There is mild diaphragmatic attenuation artifact in the inferior wall and mild apical thinning but no definite evidence of ischemia. Left ventricle systolic function was calculated at 43% with mild global hypokinesis. LVEF calculation may be compromised due to difficulty with gating in A. Fibrillation. This is a low risk study. Impression: EKG 01/08/2015: Atrial fibrillation with controlled response at the rate of 86 bpm, normal axis, nonspecific T abnormality. No significant change from EKG 12/23/2014 Current Plans Complete electrocardiogram (93000) Future Plans 08/26/2438: METABOLIC PANEL, BASIC (10272) - one time Benign essential HTN (I10) Story: Labs 12/01/2014: HB 11.9/HCT 34.4 with normocytic indices, PLT 109, creatinine 1.2, potassium 3.5, CMP otherwise normal, TSH 4.09, total cholesterol 154, triglycerides 48, HDL 54, LDL 90  BMP 11/16/2013: BUN 21, serum creatinine 1.50, hemoglobin 12.2/hematocrit 36.0. Claudication, intermittent (I73.9) Future Plans 01/27/2015: LE arterial dopplers (53664) - one time Anticoagulant long-term use (Z79.01) Story: For A. Fib Current Plans Mechanism of underlying disease process and action of medications discussed with the patient. I discussed primary/secondary prevention and also dietary counceling was done. He is presently tolerating anticoagulation. This is a 2 week office visit. I have reviewed the results of the echocardiogram and the stress test with the patient and reassured him. However due to new onset of atrial fibrillation, left atrial size being small, due to reduced ejection fraction which may be related to conduction abnormality, I have recommended that we proceed with direct and cardioversion 1 to try to see whether he would maintain sinus rhythm. I have discussed with the patient that this  does not change therapy.  With regard to claudication, I'll obtain lower extremity arterial duplex and see him back after cardioversion and duplex for follow-up. No changes in the medications were done today. I have advised him to use Metamucil for constipation and a white taking any medications two hours before or after taking Metamucil.    Signed by Adrian Page, Daniels (01/08/2015  5:50 PM)  01/28/2015: BUN 18, S. Cr 1.38, eGFR 73m. Potassium 3.6.

## 2015-02-02 ENCOUNTER — Ambulatory Visit (HOSPITAL_COMMUNITY)
Admission: RE | Admit: 2015-02-02 | Discharge: 2015-02-02 | Disposition: A | Discharge status: Home / Self Care | Payer: Medicare Other | Source: Ambulatory Visit | Attending: Cardiology | Admitting: Cardiology

## 2015-02-02 ENCOUNTER — Encounter (HOSPITAL_COMMUNITY)
Admission: RE | Disposition: A | Discharge status: Home / Self Care | Payer: Self-pay | Source: Ambulatory Visit | Attending: Cardiology

## 2015-02-02 DIAGNOSIS — I739 Peripheral vascular disease, unspecified: Secondary | ICD-10-CM | POA: Diagnosis not present

## 2015-02-02 DIAGNOSIS — Z87891 Personal history of nicotine dependence: Secondary | ICD-10-CM | POA: Diagnosis not present

## 2015-02-02 DIAGNOSIS — Z5309 Procedure and treatment not carried out because of other contraindication: Secondary | ICD-10-CM | POA: Insufficient documentation

## 2015-02-02 DIAGNOSIS — N400 Enlarged prostate without lower urinary tract symptoms: Secondary | ICD-10-CM | POA: Diagnosis not present

## 2015-02-02 DIAGNOSIS — Z7951 Long term (current) use of inhaled steroids: Secondary | ICD-10-CM | POA: Diagnosis not present

## 2015-02-02 DIAGNOSIS — I10 Essential (primary) hypertension: Secondary | ICD-10-CM | POA: Diagnosis not present

## 2015-02-02 DIAGNOSIS — I4891 Unspecified atrial fibrillation: Secondary | ICD-10-CM | POA: Diagnosis not present

## 2015-02-02 DIAGNOSIS — Z79899 Other long term (current) drug therapy: Secondary | ICD-10-CM | POA: Insufficient documentation

## 2015-02-02 DIAGNOSIS — Z7901 Long term (current) use of anticoagulants: Secondary | ICD-10-CM | POA: Insufficient documentation

## 2015-02-02 DIAGNOSIS — Z8673 Personal history of transient ischemic attack (TIA), and cerebral infarction without residual deficits: Secondary | ICD-10-CM | POA: Insufficient documentation

## 2015-02-02 DIAGNOSIS — Z79891 Long term (current) use of opiate analgesic: Secondary | ICD-10-CM | POA: Diagnosis not present

## 2015-02-02 SURGERY — CANCELLED PROCEDURE

## 2015-02-02 NOTE — Progress Notes (Signed)
Pt to pre procedure area. Pt told nurse that he had a full breakfast at 1000 including eggs, toast, grits, pork tenderloin and coffee.  Dr. Einar Gip paged and made aware. Per Dr. Einar Gip pt will need to call his office to re-schedule.

## 2015-02-25 DIAGNOSIS — I4891 Unspecified atrial fibrillation: Secondary | ICD-10-CM | POA: Diagnosis not present

## 2015-03-01 NOTE — H&P (Signed)
OFFICE VISIT NOTES COPIED TO EPIC FOR DOCUMENTATION  Adrian Daniels. Nodal 01/24/15 8:59 AM Location: [facility] Patient #: [id number] DOB: Jan 25, 1935 Divorced / Language: [place] / Race: Black or African American Male   History of Present Illness Adrian Page MD; 01/24/2015 10:07 AM) Patient words: F/U for echo and nuc test results.  The patient is a 80 year old male who presents for a follow-up for Atrial fibrillation. He had a routine physical examination on 12/01/2014 was found to have asymptomatic atrial fibrillation. Patient denies any shortness breath or chest pain no leg edema, painful swelling of lower extremities.  Patient was started on Savaysa, however was recently discovered due to to hemorrhoids for which he underwent banding and now on anticoagulation as of 01/04/2015. Tolerating this without bleeding complications.  Additional reasons for visit:  Follow-up for Leg claudication is described as the following: His main complaint today in the office was bilateral calf pain, states that this has affected his lifestyle and he has to rest fairly frequently. Some days he is able to perform much more than others. He has not noticed any bluish discoloration or ulceration in the feet. States that both feet hurt especially in the calf. Pain comes on with activity and is relieved with rest. No new symptoms since last OV.   Problem List/Past Medical Adrian Daniels; 2015/01/24 9:30 AM) Benign essential HTN (I10) Labs 12/01/2014: HB 11.9/HCT 34.4 with normocytic indices, PLT 109, creatinine 1.2, potassium 3.5, CMP otherwise normal, TSH 4.09, total cholesterol 154, triglycerides 48, HDL 54, LDL 90 BMP 11/16/2013: BUN 21, serum creatinine 1.50, hemoglobin 12.2/hematocrit 36.0. Claudication, intermittent (I73.9) BPH (benign prostatic hyperplasia) (N40.0) History of TIA (transient ischemic attack) (Z86.73)05/31/2005 Sudden onset left sided numbness, resolved on arrival to  hospital. Admitted for CVA eval with no signfiicant findings in 2007 Atrial fibrillation, new onset (I48.91)12/01/2014 CHA2DS2-VASc Score is 6 with yearly risk of stroke of 9.8%. HAS-Bled score is 2 and estimated major bleeding in one year is 1.8-2.3% Internal hemorrhoids without complication (E15.8)30/94/0768 Follows Adrian Daniels. S/P Banding 12/09/2014  Allergies (Adrian Daniels; 01-24-15 9:30 AM) No Known Drug Allergies08/01/2015  Family History Adrian Daniels; 24-Jan-2015 9:30 AM) Mother Deceased. at age 63 from MI; patient believes that she may have had a stroke but is unsure; no known cardiovascular conditions Brother 1 Deceased. at 31 years old from [place]; 10 yrs older; no other known cardiovascular conditions Father Deceased. at age 29 from natural causes; no heart attacks or strokes, no cardiovascular conditions Siblings Patient has a total of 8 siblings; Most of whom are listed had some sort of cardiovascular condition, but most of them are stable. Brother 2 In stable health. 6 yrs older; pacemaker; no other cardiovascular conditions Brother 3 Deceased. at age 57 from CVA; no other cardiovascular conditions  Social History Adrian Daniels; Jan 24, 2015 9:30 AM) Current tobacco use Former smoker. quit 25 yrs ago Alcohol Use Occasional alcohol use. Number of Children 8. Living Situation Lives alone. Marital status Divorced.  Past Surgical History Adrian Daniels; 01-24-2015 9:30 AM) Hemorrhoidectomy07/25/2016  Medication History (Adrian Daniels; Jan 24, 2015 9:35 AM) Valsartan-Hydrochlorothiazide (320-12.5MG Tablet, 1 Oral daily) Active. Pravastatin Sodium (80MG Tablet, 1 Oral daily) Active. Diltiazem HCl ER Beads (300MG Capsule ER 24HR, 1 Oral daily) Active. Hydrocodone-Acetaminophen (10-325MG Tablet, 1 Oral as needed for pain) Active. Tamsulosin HCl (0.4MG Capsule, 1 Oral daily) Active. Proctosol HC (2.5% Cream, apply Rectal as needed) Active. Viagra  (100MG Tablet, 1 Oral as needed) Active. Clarinex (5MG Tablet, 1 Oral as needed) Active. Flonase (  50MCG/ACT Suspension, 1 spray each nostril Nasal as needed) Active. Savaysa (60MG Tablet, 1 Oral daily) Active. Medications Reconciled  Diagnostic Studies History (Adrian Daniels; 2015-01-13 9:07 AM) Echocardiogram08/03/2015 1. Left ventricle cavity is normal in size. Moderate concentric hypertrophy of the left ventricle. Mild decrease in global wall motion. Left ventricle regional wall motion findings: No wall motion abnormalities. Unable to evaluate diastolic function due to A. Fibrillation. Visual EF is 40-45%. Calculated EF 58%. 2. Left atrial cavity is mildly dilated. 3. Mild aortic valve leaflet calcification. Mildly restricted aortic valve leaflets. No evidence of aortic valve stenosis. Mild aortic regurgitation. 4. Mild mitral regurgitation. Mild tricuspid regurgitation. No evidence of pulmonary hypertension. Mild pulmonic regurgitation. 5. The aortic root is normal. Mildly dilated ascending aorta. 6. Compared to the study done on 06/05/2012, EF decreased from normal. A. Fibrillation is new. Nuclear stress test08/15/2016 1. The resting electrocardiogram demonstrated atrial fibrillation and normal resting conduction. Stress EKG was negative for myocardial ischemia, there was no ST-T wave changes of ischemia. However at 1 minute into recovery patient had 3 beat run of wide complex tachycardia probably A. fib with aberrancy but cannot exclude NSVT. Patient exercised for 5 minutes and 28 seconds and achieved 7.05 METS, achieving 100% of age-predicted MPHR, stress terminated due to fatigue and achieving MPHR. 2. There is mild diaphragmatic attenuation artifact in the inferior wall and mild apical thinning but no definite evidence of ischemia. Left ventricle systolic function was calculated at 43% with mild global hypokinesis. LVEF calculation may be compromised due to difficulty with gating in A.  Fibrillation. This is a low risk study.    Review of Systems Adrian Page MD; 01/13/15 10:16 AM) General Present- Feeling well. Not Present- Fatigue, Fever and Night Sweats. Skin Not Present- Itching and Rash. HEENT Not Present- Headache. Respiratory Not Present- Difficulty Breathing. Cardiovascular Present- Claudications. Not Present- Fainting, Orthopnea and Swelling of Extremities. Gastrointestinal Present- Constipation. Not Present- Abdominal Pain, Black, Tarry Stool, Bloody Stool, Diarrhea, Nausea and Vomiting. Musculoskeletal Not Present- Joint Swelling. Neurological Not Present- Headaches. Hematology Not Present- Blood Clots, Easy Bruising and Nose Bleed.  Vitals Adrian Daniels; 2015-01-13 9:38 AM) Jan 13, 2015 9:31 AM Weight: 191 lb Height: 71in Body Surface Area: 2.07 m Body Mass Index: 26.64 kg/m  Pulse: 91 (Regular)  P.OX: 98% (Room air) BP: 138/82 (Sitting, Left Arm, Standard)       Physical Exam Adrian Page, MD; 01-13-2015 10:16 AM) General Mental Status-Alert. General Appearance-Cooperative, Appears stated age, Not in acute distress. Orientation-Oriented X3. Build & Nutrition-Well built.  Head and Neck Thyroid Gland Characteristics - no palpable nodules, no palpable enlargement.  Chest and Lung Exam Palpation Tender - No chest wall tenderness. Auscultation Breath sounds - Clear.  Cardiovascular Inspection Jugular vein - Right - No Distention. Auscultation Heart Sounds - S1 is variable, S2 WNL and No gallop present. Murmurs & Other Heart Sounds - Murmur - No murmur.  Abdomen Palpation/Percussion Normal exam - Non Tender and No hepatosplenomegaly. Auscultation Normal exam - Bowel sounds normal.  Peripheral Vascular Lower Extremity Inspection - Left - No Pigmentation, No Varicose veins. Right - No Pigmentation, No Varicose veins. Palpation - Edema - Bilateral - No edema. Femoral pulse - Bilateral - Normal. Popliteal  pulse - Left - Normal. Right - Feeble. Dorsalis pedis pulse - Left - Normal. Right - Absent. Posterior tibial pulse - Left - Normal. Right - Absent. Carotid arteries - Bilateral-No Carotid bruit. Abdomen-No prominent abdominal aortic pulsation, No epigastric bruit.  Neurologic Motor-Grossly intact without any  focal deficits.  Musculoskeletal Global Assessment Left Lower Extremity - normal range of motion without pain. Right Lower Extremity - normal range of motion without pain.    Assessment & Plan Adrian Page MD; 01/08/2015 5:50 PM) Atrial fibrillation, new onset (I48.91) Story: CHA2DS2-VASc Score is 6 with yearly risk of stroke of 9.8%. HAS-Bled score is 2 and estimated major bleeding in one year is 1.8-2.3%  Echocardiogram 12/24/2014: 1. Left ventricle cavity is normal in size. Moderate concentric hypertrophy of the left ventricle. Mild decrease in global wall motion. Left ventricle regional wall motion findings: No wall motion abnormalities. Unable to evaluate diastolic function due to A. Fibrillation. Visual EF is 40-45%. Calculated EF 58%. 2. Left atrial cavity is mildly dilated. 3. Mild aortic valve leaflet calcification. Mildly restricted aortic valve leaflets. No evidence of aortic valve stenosis. Mild aortic regurgitation. 4. Mild mitral regurgitation. Mild tricuspid regurgitation. No evidence of pulmonary hypertension. Mild pulmonic regurgitation. 5. The aortic root is normal. Mildly dilated ascending aorta. 6. Compared to the study done on 06/05/2012, EF decreased from normal. A. Fibrillation is new.  Exercise myoview stress 12/28/2014: 1. The resting electrocardiogram demonstrated atrial fibrillation and normal resting conduction. Stress EKG was negative for myocardial ischemia, there was no ST-T wave changes of ischemia. However at 1 minute into recovery patient had 3 beat run of wide complex tachycardia probably A. fib with aberrancy but cannot exclude NSVT.  Patient exercised for 5 minutes and 28 seconds and achieved 7.05 METS, achieving 100% of age-predicted MPHR, stress terminated due to fatigue and achieving MPHR. 2. There is mild diaphragmatic attenuation artifact in the inferior wall and mild apical thinning but no definite evidence of ischemia. Left ventricle systolic function was calculated at 43% with mild global hypokinesis. LVEF calculation may be compromised due to difficulty with gating in A. Fibrillation. This is a low risk study. Impression: EKG 01/08/2015: Atrial fibrillation with controlled response at the rate of 86 bpm, normal axis, nonspecific T abnormality. No significant change from EKG 12/23/2014 Current Plans Complete electrocardiogram (93000) Future Plans 08/26/2438: METABOLIC PANEL, BASIC (10272) - one time Benign essential HTN (I10) Story: Labs 12/01/2014: HB 11.9/HCT 34.4 with normocytic indices, PLT 109, creatinine 1.2, potassium 3.5, CMP otherwise normal, TSH 4.09, total cholesterol 154, triglycerides 48, HDL 54, LDL 90  BMP 11/16/2013: BUN 21, serum creatinine 1.50, hemoglobin 12.2/hematocrit 36.0. Claudication, intermittent (I73.9) Future Plans 01/27/2015: LE arterial dopplers (53664) - one time Anticoagulant long-term use (Z79.01) Story: For A. Fib Current Plans Mechanism of underlying disease process and action of medications discussed with the patient. I discussed primary/secondary prevention and also dietary counceling was done. He is presently tolerating anticoagulation. This is a 2 week office visit. I have reviewed the results of the echocardiogram and the stress test with the patient and reassured him. However due to new onset of atrial fibrillation, left atrial size being small, due to reduced ejection fraction which may be related to conduction abnormality, I have recommended that we proceed with direct and cardioversion 1 to try to see whether he would maintain sinus rhythm. I have discussed with the patient that this  does not change therapy.  With regard to claudication, I'll obtain lower extremity arterial duplex and see him back after cardioversion and duplex for follow-up. No changes in the medications were done today. I have advised him to use Metamucil for constipation and a white taking any medications two hours before or after taking Metamucil.    Signed by Adrian Page, MD (01/08/2015  5:50 PM)

## 2015-03-02 ENCOUNTER — Encounter (HOSPITAL_COMMUNITY)
Admission: RE | Disposition: A | Discharge status: Home / Self Care | Payer: Self-pay | Source: Ambulatory Visit | Attending: Cardiology

## 2015-03-02 ENCOUNTER — Ambulatory Visit (HOSPITAL_COMMUNITY): Payer: Medicare Other | Admitting: Certified Registered Nurse Anesthetist

## 2015-03-02 ENCOUNTER — Encounter (HOSPITAL_COMMUNITY): Payer: Self-pay | Admitting: *Deleted

## 2015-03-02 ENCOUNTER — Ambulatory Visit (HOSPITAL_COMMUNITY)
Admission: RE | Admit: 2015-03-02 | Discharge: 2015-03-02 | Disposition: A | Discharge status: Home / Self Care | Payer: Medicare Other | Source: Ambulatory Visit | Attending: Cardiology | Admitting: Cardiology

## 2015-03-02 DIAGNOSIS — I4891 Unspecified atrial fibrillation: Secondary | ICD-10-CM | POA: Insufficient documentation

## 2015-03-02 DIAGNOSIS — Z87891 Personal history of nicotine dependence: Secondary | ICD-10-CM | POA: Insufficient documentation

## 2015-03-02 DIAGNOSIS — K642 Third degree hemorrhoids: Secondary | ICD-10-CM | POA: Diagnosis not present

## 2015-03-02 DIAGNOSIS — I10 Essential (primary) hypertension: Secondary | ICD-10-CM | POA: Insufficient documentation

## 2015-03-02 DIAGNOSIS — D649 Anemia, unspecified: Secondary | ICD-10-CM | POA: Diagnosis not present

## 2015-03-02 HISTORY — PX: CARDIOVERSION: SHX1299

## 2015-03-02 SURGERY — CARDIOVERSION
Anesthesia: General

## 2015-03-02 MED ORDER — LIDOCAINE HCL (CARDIAC) 20 MG/ML IV SOLN
INTRAVENOUS | Status: DC | PRN
  Administered 2015-03-02: 60 mg via INTRAVENOUS

## 2015-03-02 MED ORDER — SODIUM CHLORIDE 0.9 % IV SOLN
INTRAVENOUS | Status: DC
  Administered 2015-03-02: 11:00:00 via INTRAVENOUS

## 2015-03-02 MED ORDER — PROPOFOL 10 MG/ML IV BOLUS
INTRAVENOUS | Status: DC | PRN
  Administered 2015-03-02: 100 mg via INTRAVENOUS

## 2015-03-02 NOTE — Anesthesia Preprocedure Evaluation (Addendum)
Anesthesia Evaluation  Patient identified by MRN, date of birth, ID band Patient awake    Reviewed: Allergy & Precautions, NPO status , Patient's Chart, lab work & pertinent test results  Airway Mallampati: II  TM Distance: >3 FB Neck ROM: Full    Dental  (+) Dental Advisory Given   Pulmonary former smoker,    breath sounds clear to auscultation       Cardiovascular hypertension, Pt. on medications  Rhythm:Irregular Rate:Normal     Neuro/Psych    GI/Hepatic   Endo/Other    Renal/GU      Musculoskeletal   Abdominal   Peds  Hematology  (+) anemia ,   Anesthesia Other Findings   Reproductive/Obstetrics                           Anesthesia Physical Anesthesia Plan  ASA: III  Anesthesia Plan: General   Post-op Pain Management:    Induction: Intravenous  Airway Management Planned: Mask  Additional Equipment:   Intra-op Plan:   Post-operative Plan:   Informed Consent: I have reviewed the patients History and Physical, chart, labs and discussed the procedure including the risks, benefits and alternatives for the proposed anesthesia with the patient or authorized representative who has indicated his/her understanding and acceptance.   Dental advisory given  Plan Discussed with: CRNA and Anesthesiologist  Anesthesia Plan Comments:        Anesthesia Quick Evaluation

## 2015-03-02 NOTE — Interval H&P Note (Signed)
History and Physical Interval Note:  03/02/2015 12:15 PM  Adrian Daniels  has presented today for surgery, with the diagnosis of AFIB  The various methods of treatment have been discussed with the patient and family. After consideration of risks, benefits and other options for treatment, the patient has consented to  Procedure(s): CARDIOVERSION (N/A) as a surgical intervention .  The patient's history has been reviewed, patient examined, no change in status, stable for surgery.  I have reviewed the patient's chart and labs.  Questions were answered to the patient's satisfaction.     Adrian Prows

## 2015-03-02 NOTE — Anesthesia Postprocedure Evaluation (Signed)
  Anesthesia Post-op Note  Patient: Adrian Daniels  Procedure(s) Performed: Procedure(s): CARDIOVERSION (N/A)  Patient Location: PACU  Anesthesia Type:General  Level of Consciousness: awake and alert   Airway and Oxygen Therapy: Patient Spontanous Breathing  Post-op Pain: none  Post-op Assessment: Post-op Vital signs reviewed              Post-op Vital Signs: Reviewed  Last Vitals:  Filed Vitals:   03/02/15 1305  BP: 142/92  Pulse: 57  Resp: 11    Complications: No apparent anesthesia complications

## 2015-03-02 NOTE — Discharge Instructions (Signed)
Electrical Cardioversion, Care After °Refer to this sheet in the next few weeks. These instructions provide you with information on caring for yourself after your procedure. Your health care provider may also give you more specific instructions. Your treatment has been planned according to current medical practices, but problems sometimes occur. Call your health care provider if you have any problems or questions after your procedure. °WHAT TO EXPECT AFTER THE PROCEDURE °After your procedure, it is typical to have the following sensations: °· Some redness on the skin where the shocks were delivered. If this is tender, a sunburn lotion or hydrocortisone cream may help. °· Possible return of an abnormal heart rhythm within hours or days after the procedure. °HOME CARE INSTRUCTIONS °· Take medicines only as directed by your health care provider. Be sure you understand how and when to take your medicine. °· Learn how to feel your pulse and check it often. °· Limit your activity for 48 hours after the procedure or as directed by your health care provider. °· Avoid or minimize caffeine and other stimulants as directed by your health care provider. °SEEK MEDICAL CARE IF: °· You feel like your heart is beating too fast or your pulse is not regular. °· You have any questions about your medicines. °· You have bleeding that will not stop. °SEEK IMMEDIATE MEDICAL CARE IF: °· You are dizzy or feel faint. °· It is hard to breathe or you feel short of breath. °· There is a change in discomfort in your chest. °· Your speech is slurred or you have trouble moving an arm or leg on one side of your body. °· You get a serious muscle cramp that does not go away. °· Your fingers or toes turn cold or blue. °  °This information is not intended to replace advice given to you by your health care provider. Make sure you discuss any questions you have with your health care provider. °  °Document Released: 02/19/2013 Document Revised: 05/22/2014  Document Reviewed: 02/19/2013 °Elsevier Interactive Patient Education ©2016 [employer]vier Inc[employer] ° °

## 2015-03-02 NOTE — Transfer of Care (Signed)
Immediate Anesthesia Transfer of Care Note  Patient: Adrian Daniels  Procedure(s) Performed: Procedure(s): CARDIOVERSION (N/A)  Patient Location: Endoscopy Unit  Anesthesia Type:General  Level of Consciousness: awake and alert   Airway & Oxygen Therapy: Patient Spontanous Breathing and Patient connected to nasal cannula oxygen  Post-op Assessment: Report given to RN and Post -op Vital signs reviewed and stable  Post vital signs: Reviewed and stable  Last Vitals:  Filed Vitals:   03/02/15 1227  BP: 162/101  Pulse: 71  Resp: 17    Complications: No apparent anesthesia complications

## 2015-03-02 NOTE — CV Procedure (Signed)
Direct current cardioversion:  Indication symptomatic A. Fibrillation.  Procedure: Using 100 mg of IV Propofol and 60 IV Lidocaine (for reducing venous pain) for achieving deep sedation, synchronized direct current cardioversion performed. Patient was delivered with 100, 120, 150x3 Joules of electricity, unsuccessful attempt at cardioversion. Patient remained in A. Fib. No immediate complication noted.

## 2015-03-03 ENCOUNTER — Encounter (HOSPITAL_COMMUNITY): Payer: Self-pay | Admitting: Cardiology

## 2015-03-12 DIAGNOSIS — Z7901 Long term (current) use of anticoagulants: Secondary | ICD-10-CM | POA: Diagnosis not present

## 2015-03-12 DIAGNOSIS — I739 Peripheral vascular disease, unspecified: Secondary | ICD-10-CM | POA: Diagnosis not present

## 2015-03-12 DIAGNOSIS — I10 Essential (primary) hypertension: Secondary | ICD-10-CM | POA: Diagnosis not present

## 2015-03-12 DIAGNOSIS — I4891 Unspecified atrial fibrillation: Secondary | ICD-10-CM | POA: Diagnosis not present

## 2015-04-01 DIAGNOSIS — E7800 Pure hypercholesterolemia, unspecified: Secondary | ICD-10-CM | POA: Diagnosis not present

## 2015-04-01 DIAGNOSIS — Z125 Encounter for screening for malignant neoplasm of prostate: Secondary | ICD-10-CM | POA: Diagnosis not present

## 2015-04-01 DIAGNOSIS — I10 Essential (primary) hypertension: Secondary | ICD-10-CM | POA: Diagnosis not present

## 2015-04-01 DIAGNOSIS — Z0000 Encounter for general adult medical examination without abnormal findings: Secondary | ICD-10-CM | POA: Diagnosis not present

## 2015-04-07 DIAGNOSIS — E7800 Pure hypercholesterolemia, unspecified: Secondary | ICD-10-CM | POA: Diagnosis not present

## 2015-04-07 DIAGNOSIS — D696 Thrombocytopenia, unspecified: Secondary | ICD-10-CM | POA: Diagnosis not present

## 2015-04-07 DIAGNOSIS — I10 Essential (primary) hypertension: Secondary | ICD-10-CM | POA: Diagnosis not present

## 2015-04-07 DIAGNOSIS — D72819 Decreased white blood cell count, unspecified: Secondary | ICD-10-CM | POA: Diagnosis not present

## 2015-05-24 DIAGNOSIS — R3121 Asymptomatic microscopic hematuria: Secondary | ICD-10-CM | POA: Diagnosis not present

## 2015-05-24 DIAGNOSIS — Z0000 Encounter for general adult medical examination without abnormal findings: Secondary | ICD-10-CM | POA: Diagnosis not present

## 2015-05-24 DIAGNOSIS — R972 Elevated prostate specific antigen [PSA]: Secondary | ICD-10-CM | POA: Diagnosis not present

## 2015-06-02 DIAGNOSIS — R3121 Asymptomatic microscopic hematuria: Secondary | ICD-10-CM | POA: Diagnosis not present

## 2015-06-02 DIAGNOSIS — R3129 Other microscopic hematuria: Secondary | ICD-10-CM | POA: Diagnosis not present

## 2015-07-02 DIAGNOSIS — K4090 Unilateral inguinal hernia, without obstruction or gangrene, not specified as recurrent: Secondary | ICD-10-CM | POA: Diagnosis not present

## 2015-07-13 ENCOUNTER — Ambulatory Visit: Payer: Self-pay | Admitting: Surgery

## 2015-07-13 DIAGNOSIS — K4090 Unilateral inguinal hernia, without obstruction or gangrene, not specified as recurrent: Secondary | ICD-10-CM | POA: Diagnosis not present

## 2015-07-13 NOTE — H&P (Signed)
History of Present Illness Adrian Daniels. Adrian Gionfriddo MD; 07/13/2015 3:01 PM) The patient is a 81 year old male who presents with an inguinal hernia. PCP - [facility] - Einar Gip  This is a 81 year old male who presents with several months of a visible palpable bulge in his left groin. This has become mildly enlarged and causes some minimal discomfort. The patient has not tried to reduce this area. He denies any GI obstructive symptoms. He was examined by Dr. Dorina Hoyer who noted a reducible left inguinal hernia.  He is anticoagulated for atrial fibrillation. He saw Dr. Einar Gip about 1 month ago.   Other Problems Erasmo Leventhal, RN, BSN; 07/13/2015 2:19 PM) High blood pressure Umbilical Hernia Repair  Past Surgical History Erasmo Leventhal, RN, BSN; 07/13/2015 2:19 PM) No pertinent past surgical history  Diagnostic Studies History Erasmo Leventhal, RN, BSN; 07/13/2015 2:19 PM) Colonoscopy 1-5 years ago  Allergies Erasmo Leventhal, RN, BSN; 07/13/2015 2:19 PM) No Known Drug Allergies 07/13/2015  Medication History (Erasmo Leventhal, RN, BSN; 07/13/2015 2:21 PM) DiltiaZEM HCl ER Beads (300MG  Capsule ER 24HR, Oral daily) Active. Pravastatin Sodium (80MG  Tablet, Oral daily) Active. Valsartan-Hydrochlorothiazide (320-12.5MG  Tablet, Oral daily) Active. Savaysa (60MG  Tablet, Oral daily) Active. Claritin (10MG  Tablet, Oral as needed) Active. Medications Reconciled  Social History (Erasmo Leventhal, RN, BSN; 07/13/2015 2:19 PM) Alcohol use Occasional alcohol use. Caffeine use Carbonated beverages, Coffee, Tea. No drug use Tobacco use Never smoker.  Family History (Erasmo Leventhal, RN, BSN; 07/13/2015 2:19 PM) Diabetes Mellitus Daughter. Kidney Disease Daughter.     Review of Systems Occupational hygienist, BSN; 07/13/2015 2:19 PM) General Not Present- Appetite Loss, Chills, Fatigue, Fever, Night Sweats, Weight Gain and Weight Loss. Skin Not Present- Change in Wart/Mole, Dryness,  Hives, Jaundice, New Lesions, Non-Healing Wounds, Rash and Ulcer. HEENT Present- Wears glasses/contact lenses. Not Present- Earache, Hearing Loss, Hoarseness, Nose Bleed, Oral Ulcers, Ringing in the Ears, Seasonal Allergies, Sinus Pain, Sore Throat, Visual Disturbances and Yellow Eyes. Respiratory Not Present- Bloody sputum, Chronic Cough, Difficulty Breathing, Snoring and Wheezing. Breast Not Present- Breast Mass, Breast Pain, Nipple Discharge and Skin Changes. Male Genitourinary Present- Nocturia. Not Present- Blood in Urine, Change in Urinary Stream, Frequency, Impotence, Painful Urination, Urgency and Urine Leakage. Musculoskeletal Not Present- Back Pain, Joint Pain, Joint Stiffness, Muscle Pain, Muscle Weakness and Swelling of Extremities. Neurological Not Present- Decreased Memory, Fainting, Headaches, Numbness, Seizures, Tingling, Tremor, Trouble walking and Weakness. Psychiatric Not Present- Anxiety, Bipolar, Change in Sleep Pattern, Depression, Fearful and Frequent crying. Endocrine Not Present- Cold Intolerance, Excessive Hunger, Hair Changes, Heat Intolerance, Hot flashes and New Diabetes. Hematology Not Present- Easy Bruising, Excessive bleeding, Gland problems, HIV and Persistent Infections.  Vitals Occupational hygienist, BSN; 07/13/2015 2:19 PM) 07/13/2015 2:19 PM Weight: 189.6 lb Height: 72in Body Surface Area: 2.08 m Body Mass Index: 25.71 kg/m  Temp.: 98.64F(Oral)  Pulse: 74 (Regular)  BP: 148/90 (Sitting, Left Arm, Standard)      Physical Exam Rodman Key K. Karmelo Bass MD; 07/13/2015 3:02 PM)  The physical exam findings are as follows: Note:WDWN in NAD HEENT: EOMI, sclera anicteric Neck: No masses, no thyromegaly Lungs: CTA bilaterally; normal respiratory effort CV: Regular rate and rhythm; no murmurs Abd: +bowel sounds, soft, non-tender, no masses GU; bilateral descended testes; no testicular masses; visible palpable reducible left inguinal hernia; no right  inguinal hernia Ext: Well-perfused; no edema Skin: Warm, dry; no sign of jaundice    Assessment & Plan Rodman Key K. Zymiere Trostle MD; 07/13/2015 2:42 PM)  LEFT INGUINAL HERNIA (K40.90)  Current Plans  Schedule for Surgery - Left inguinal hernia repair with mesh. The surgical procedure has been discussed with the patient. Potential risks, benefits, alternative treatments, and expected outcomes have been explained. All of the patient's questions at this time have been answered. The likelihood of reaching the patient's treatment goal is good. The patient understand the proposed surgical procedure and wishes to proceed.  Cardiac clearance first by Dr. Sheryle Hail. Georgette Dover, MD, [facility]  General/ Trauma Surgery  07/13/2015 3:04 PM

## 2015-08-04 ENCOUNTER — Other Ambulatory Visit (HOSPITAL_COMMUNITY): Payer: Self-pay | Admitting: *Deleted

## 2015-08-04 ENCOUNTER — Encounter (HOSPITAL_COMMUNITY): Payer: Self-pay

## 2015-08-04 ENCOUNTER — Encounter (HOSPITAL_COMMUNITY)
Admission: RE | Admit: 2015-08-04 | Discharge: 2015-08-04 | Disposition: A | Discharge status: Home / Self Care | Payer: Medicare Other | Source: Ambulatory Visit | Attending: Surgery | Admitting: Surgery

## 2015-08-04 DIAGNOSIS — Z87891 Personal history of nicotine dependence: Secondary | ICD-10-CM | POA: Insufficient documentation

## 2015-08-04 DIAGNOSIS — I10 Essential (primary) hypertension: Secondary | ICD-10-CM | POA: Diagnosis not present

## 2015-08-04 DIAGNOSIS — Z79899 Other long term (current) drug therapy: Secondary | ICD-10-CM | POA: Diagnosis not present

## 2015-08-04 DIAGNOSIS — I4891 Unspecified atrial fibrillation: Secondary | ICD-10-CM | POA: Insufficient documentation

## 2015-08-04 DIAGNOSIS — E785 Hyperlipidemia, unspecified: Secondary | ICD-10-CM | POA: Insufficient documentation

## 2015-08-04 DIAGNOSIS — K4090 Unilateral inguinal hernia, without obstruction or gangrene, not specified as recurrent: Secondary | ICD-10-CM | POA: Insufficient documentation

## 2015-08-04 DIAGNOSIS — Z01818 Encounter for other preprocedural examination: Secondary | ICD-10-CM | POA: Diagnosis not present

## 2015-08-04 DIAGNOSIS — Z01812 Encounter for preprocedural laboratory examination: Secondary | ICD-10-CM | POA: Diagnosis not present

## 2015-08-04 HISTORY — DX: Presence of dental prosthetic device (complete) (partial): Z97.2

## 2015-08-04 HISTORY — DX: Cardiac arrhythmia, unspecified: I49.9

## 2015-08-04 LAB — BASIC METABOLIC PANEL
ANION GAP: 8 (ref 5–15)
BUN: 14 mg/dL (ref 6–20)
CO2: 27 mmol/L (ref 22–32)
Calcium: 9.3 mg/dL (ref 8.9–10.3)
Chloride: 108 mmol/L (ref 101–111)
Creatinine, Ser: 1.41 mg/dL — ABNORMAL HIGH (ref 0.61–1.24)
GFR calc Af Amer: 53 mL/min — ABNORMAL LOW (ref >60)
GFR, EST NON AFRICAN AMERICAN: 46 mL/min — AB (ref >60)
Glucose, Bld: 92 mg/dL (ref 65–99)
POTASSIUM: 4.1 mmol/L (ref 3.5–5.1)
SODIUM: 143 mmol/L (ref 135–145)

## 2015-08-04 LAB — PROTIME-INR
INR: 1.21 (ref 0.00–1.49)
Prothrombin Time: 15.5 seconds — ABNORMAL HIGH (ref 11.6–15.2)

## 2015-08-04 LAB — CBC
HEMATOCRIT: 34.3 % — AB (ref 39.0–52.0)
Hemoglobin: 11.9 g/dL — ABNORMAL LOW (ref 13.0–17.0)
MCH: 29.8 pg (ref 26.0–34.0)
MCHC: 34.7 g/dL (ref 30.0–36.0)
MCV: 86 fL (ref 78.0–100.0)
Platelets: 150 10*3/uL (ref 150–400)
RBC: 3.99 MIL/uL — ABNORMAL LOW (ref 4.22–5.81)
RDW: 13.6 % (ref 11.5–15.5)
WBC: 3.7 10*3/uL — AB (ref 4.0–10.5)

## 2015-08-04 NOTE — Pre-Procedure Instructions (Signed)
Colvin Rayhill Titsworth  08/04/2015      [id number] [place], [place] [id number] [place] [id number] Phone: [phone] Fax: [phone]    Your procedure is scheduled on Tuesday, August 10, 2015 at 10:00 AM.   Report to [facility] Entrance "A" Admitting Office at 8:00 AM.   Call this number if you have problems the morning of surgery: [phone]   Any questions prior to day of surgery, please call [phone] between 8 & 4 PM.   Remember:  Do not eat food or drink liquids after midnight Monday, 08/09/15.  Take these medicines the morning of surgery with A SIP OF WATER: Diltiazem (Cardizem), Tramadol - if needed, Loratadine (Claritin) - if needed  Stop Edoxaban [facility]) as instructed by your physician.   Do not wear jewelry.  Do not wear lotions, powders, or cologne.  You may wear deodorant.  Men may shave face and neck.  Do not bring valuables to the hospital.  [facility] is not responsible for any belongings or valuables.  Contacts, dentures or bridgework may not be worn into surgery.  Leave your suitcase in the car.  After surgery it may be brought to your room.  For patients admitted to the hospital, discharge time will be determined by your treatment team.  Patients discharged the day of surgery will not be allowed to drive home.   Special instructions:  [place] - Preparing for Surgery  Before surgery, you can play an important role.  Because skin is not sterile, your skin needs to be as free of germs as possible.  You can reduce the number of germs on you skin by washing with CHG (chlorahexidine gluconate) soap before surgery.  CHG is an antiseptic cleaner which kills germs and bonds with the skin to continue killing germs even after washing.  Please DO NOT use if you have an allergy to CHG or antibacterial soaps.  If your skin becomes reddened/irritated stop using the CHG and  inform your nurse when you arrive at Short Stay.  Do not shave (including legs and underarms) for at least 48 hours prior to the first CHG shower.  You may shave your face.  Please follow these instructions carefully:   1.  Shower with CHG Soap the night before surgery and the                                morning of Surgery.  2.  If you choose to wash your hair, wash your hair first as usual with your       normal shampoo.  3.  After you shampoo, rinse your hair and body thoroughly to remove the                      Shampoo.  4.  Use CHG as you would any other liquid soap.  You can apply chg directly       to the skin and wash gently with scrungie or a clean washcloth.  5.  Apply the CHG Soap to your body ONLY FROM THE NECK DOWN.        Do not use on open wounds or open sores.  Avoid contact with your eyes, ears, mouth and genitals (private parts).  Wash genitals (private parts) with your normal soap.  6.  Wash thoroughly, paying  special attention to the area where your surgery        will be performed.  7.  Thoroughly rinse your body with warm water from the neck down.  8.  DO NOT shower/wash with your normal soap after using and rinsing off       the CHG Soap.  9.  Pat yourself dry with a clean towel.            10.  Wear clean pajamas.            11.  Place clean sheets on your bed the night of your first shower and do not        sleep with pets.  Day of Surgery  Do not apply any lotions the morning of surgery.  Please wear clean clothes to the hospital.   Please read over the following fact sheets that you were given. Pain Booklet, Coughing and Deep Breathing and Surgical Site Infection Prevention

## 2015-08-04 NOTE — Progress Notes (Signed)
Patient has hx of atrial fibrillation and follows with Dr. Einar Gip.  Will request records from his office.  Patient is on Savaysa and states he stopped it 3-4 days ago per Dr. Irven Shelling office but was told they would call another medication into his pharmacy which hasn't been (per patient).  He says he has been trying to call their office without luck so is planning on going by there now after PAT appt.

## 2015-08-05 NOTE — Progress Notes (Signed)
Anesthesia Chart Review:  Pt is a 81 year old male scheduled for L inguinal hernia repair with mesh on 08/10/2015 with Dr. Georgette Dover.   Cardiologist is Dr. Adrian Prows.   PMH includes:  Atrial fibrillation, HTN, hyperlipidemia, anemia. Former smoker. BMI 26.5  Medications include: diltiazem, savaysa, pravastatin, valsartan-hctz. Pt stopped savaysa ~4 days ago (08/01/15). By notes, pt needs lovenox bridge but pt reported he has not received rx yet. I spoke with Mendel Ryder in Dr. Irven Shelling office this morning; pt received Rx for lovenox and instructions for bridging protocol 08/04/15.   Preoperative labs reviewed.  PT 15.5.   EKG 03/12/15: atrial fibrillation with controlled response rate (74 bpm), nonspecific T abnormality, borderline criteria for LVH.   Nuclear stress test 12/28/14: Mild attenuation artifact in the inferior wall and mild apical thinning but no definite evidence of ischemia. EF 43% with mild global hypokinesis. EF calculation may be compromised due to afib. Low risk study.   Echo 12/24/14:  1. LV cavity normal in size, moderate concentric LVH. Mild decrease in global wall motion. No wall motion abnormalities. Unable to evaluate diastolic function due to afib. Visual EF 40-45%. Calculated EF 58% 2. LA mildly dilated 3. Mild aortic valve leaflet calcification. Mildly restricted aortic valve leaflets. No evidence aortic stenosis 4. Mild MR. Mild TR. No evidence of pulmonary HTN. Mild PR.  5. Aortic root is normal. Mildly dilated ascending aorta.   Pt has cardiac clearance for surgery from Dr. Einar Gip at low risk.   If no changes, I anticipate pt can proceed with surgery as scheduled.   Willeen Cass, FNP-BC [facility] Short Stay Surgical Center/Anesthesiology Phone: [phone] 08/05/2015 11:26 AM

## 2015-08-09 NOTE — Anesthesia Preprocedure Evaluation (Addendum)
Anesthesia Evaluation  Patient identified by MRN, date of birth, ID band Patient awake    Reviewed: Allergy & Precautions, NPO status , Patient's Chart, lab work & pertinent test results  Airway Mallampati: II  TM Distance: >3 FB Neck ROM: Full    Dental  (+) Dental Advisory Given, Edentulous Upper, Poor Dentition, Missing,    Pulmonary former smoker,    breath sounds clear to auscultation       Cardiovascular hypertension, Pt. on medications + dysrhythmias Atrial Fibrillation  Rhythm:Irregular Rate:Normal  Has cardiac clearance, STRESS 12/2014 EF 43%, ECHO 12/2014 EF 58%    Neuro/Psych negative neurological ROS  negative psych ROS   GI/Hepatic negative GI ROS, Neg liver ROS,   Endo/Other    Renal/GU Renal InsufficiencyRenal diseaseCreat 1.41     Musculoskeletal   Abdominal   Peds  Hematology  (+) anemia , 12/34   Anesthesia Other Findings   Reproductive/Obstetrics                            Anesthesia Physical Anesthesia Plan  ASA: III  Anesthesia Plan: General   Post-op Pain Management:    Induction: Intravenous  Airway Management Planned: LMA and Oral ETT  Additional Equipment:   Intra-op Plan:   Post-operative Plan:   Informed Consent: I have reviewed the patients History and Physical, chart, labs and discussed the procedure including the risks, benefits and alternatives for the proposed anesthesia with the patient or authorized representative who has indicated his/her understanding and acceptance.     Plan Discussed with:   Anesthesia Plan Comments: (Will offer TAP block)        Anesthesia Quick Evaluation

## 2015-08-10 ENCOUNTER — Ambulatory Visit (HOSPITAL_COMMUNITY): Payer: Medicare Other | Admitting: Anesthesiology

## 2015-08-10 ENCOUNTER — Encounter (HOSPITAL_COMMUNITY): Payer: Self-pay | Admitting: General Practice

## 2015-08-10 ENCOUNTER — Ambulatory Visit (HOSPITAL_COMMUNITY)
Admission: RE | Admit: 2015-08-10 | Discharge: 2015-08-10 | Disposition: A | Discharge status: Home / Self Care | Payer: Medicare Other | Source: Ambulatory Visit | Attending: Surgery | Admitting: Surgery

## 2015-08-10 ENCOUNTER — Ambulatory Visit (HOSPITAL_COMMUNITY): Payer: Medicare Other | Admitting: Emergency Medicine

## 2015-08-10 ENCOUNTER — Encounter (HOSPITAL_COMMUNITY)
Admission: RE | Disposition: A | Discharge status: Home / Self Care | Payer: Self-pay | Source: Ambulatory Visit | Attending: Surgery

## 2015-08-10 DIAGNOSIS — K4090 Unilateral inguinal hernia, without obstruction or gangrene, not specified as recurrent: Secondary | ICD-10-CM | POA: Insufficient documentation

## 2015-08-10 DIAGNOSIS — I4891 Unspecified atrial fibrillation: Secondary | ICD-10-CM | POA: Diagnosis not present

## 2015-08-10 DIAGNOSIS — Z7901 Long term (current) use of anticoagulants: Secondary | ICD-10-CM | POA: Diagnosis not present

## 2015-08-10 DIAGNOSIS — G8918 Other acute postprocedural pain: Secondary | ICD-10-CM | POA: Diagnosis not present

## 2015-08-10 HISTORY — PX: INGUINAL HERNIA REPAIR: SHX194

## 2015-08-10 HISTORY — PX: INSERTION OF MESH: SHX5868

## 2015-08-10 SURGERY — REPAIR, HERNIA, INGUINAL, ADULT
Anesthesia: General | Site: Groin | Laterality: Left

## 2015-08-10 MED ORDER — ONDANSETRON HCL 4 MG/2ML IJ SOLN: 4.0000 mg | INTRAMUSCULAR | Status: DC | PRN

## 2015-08-10 MED ORDER — BUPIVACAINE-EPINEPHRINE 0.25% -1:200000 IJ SOLN
INTRAMUSCULAR | Status: DC | PRN
  Administered 2015-08-10: 10 mL

## 2015-08-10 MED ORDER — MIDAZOLAM HCL 2 MG/2ML IJ SOLN
INTRAMUSCULAR | Status: AC
  Filled 2015-08-10: qty 2

## 2015-08-10 MED ORDER — LIDOCAINE HCL (CARDIAC) 20 MG/ML IV SOLN
INTRAVENOUS | Status: DC | PRN
  Administered 2015-08-10: 100 mg via INTRAVENOUS

## 2015-08-10 MED ORDER — KETOROLAC TROMETHAMINE 30 MG/ML IJ SOLN
INTRAMUSCULAR | Status: DC | PRN
  Administered 2015-08-10: 15 mg via INTRAVENOUS

## 2015-08-10 MED ORDER — FENTANYL CITRATE (PF) 100 MCG/2ML IJ SOLN
INTRAMUSCULAR | Status: AC
  Filled 2015-08-10: qty 2

## 2015-08-10 MED ORDER — MIDAZOLAM HCL 2 MG/2ML IJ SOLN
1.0000 mg | Freq: Once | INTRAMUSCULAR | Status: AC
  Administered 2015-08-10: 1 mg via INTRAVENOUS

## 2015-08-10 MED ORDER — DEXAMETHASONE SODIUM PHOSPHATE 4 MG/ML IJ SOLN
INTRAMUSCULAR | Status: AC
  Filled 2015-08-10: qty 2

## 2015-08-10 MED ORDER — LIDOCAINE HCL (CARDIAC) 20 MG/ML IV SOLN
INTRAVENOUS | Status: AC
  Filled 2015-08-10: qty 5

## 2015-08-10 MED ORDER — 0.9 % SODIUM CHLORIDE (POUR BTL) OPTIME
TOPICAL | Status: DC | PRN
  Administered 2015-08-10: 1000 mL

## 2015-08-10 MED ORDER — BUPIVACAINE-EPINEPHRINE (PF) 0.25% -1:200000 IJ SOLN
INTRAMUSCULAR | Status: AC
  Filled 2015-08-10: qty 30

## 2015-08-10 MED ORDER — ONDANSETRON HCL 4 MG/2ML IJ SOLN
INTRAMUSCULAR | Status: DC | PRN
  Administered 2015-08-10: 4 mg via INTRAVENOUS

## 2015-08-10 MED ORDER — FENTANYL CITRATE (PF) 100 MCG/2ML IJ SOLN
50.0000 ug | Freq: Once | INTRAMUSCULAR | Status: AC
  Administered 2015-08-10: 50 ug via INTRAVENOUS

## 2015-08-10 MED ORDER — FENTANYL CITRATE (PF) 250 MCG/5ML IJ SOLN
INTRAMUSCULAR | Status: DC | PRN
  Administered 2015-08-10: 50 ug via INTRAVENOUS

## 2015-08-10 MED ORDER — CHLORHEXIDINE GLUCONATE 4 % EX LIQD: 1.0000 "application " | Freq: Once | CUTANEOUS | Status: DC

## 2015-08-10 MED ORDER — PROPOFOL 10 MG/ML IV BOLUS
INTRAVENOUS | Status: AC
  Filled 2015-08-10: qty 20

## 2015-08-10 MED ORDER — MORPHINE SULFATE (PF) 2 MG/ML IV SOLN: 2.0000 mg | INTRAVENOUS | Status: DC | PRN

## 2015-08-10 MED ORDER — HYDROCODONE-ACETAMINOPHEN 5-325 MG PO TABS: 1.0000 | ORAL_TABLET | ORAL | Status: DC | PRN

## 2015-08-10 MED ORDER — FENTANYL CITRATE (PF) 250 MCG/5ML IJ SOLN
INTRAMUSCULAR | Status: AC
  Filled 2015-08-10: qty 5

## 2015-08-10 MED ORDER — BUPIVACAINE-EPINEPHRINE (PF) 0.5% -1:200000 IJ SOLN
INTRAMUSCULAR | Status: DC | PRN
  Administered 2015-08-10: 30 mL

## 2015-08-10 MED ORDER — DEXAMETHASONE SODIUM PHOSPHATE 4 MG/ML IJ SOLN
INTRAMUSCULAR | Status: DC | PRN
  Administered 2015-08-10: 8 mg via INTRAVENOUS

## 2015-08-10 MED ORDER — PROPOFOL 10 MG/ML IV BOLUS
INTRAVENOUS | Status: DC | PRN
  Administered 2015-08-10: 120 mg via INTRAVENOUS

## 2015-08-10 MED ORDER — LACTATED RINGERS IV SOLN
INTRAVENOUS | Status: DC
  Administered 2015-08-10: 09:00:00 via INTRAVENOUS

## 2015-08-10 MED ORDER — ONDANSETRON HCL 4 MG/2ML IJ SOLN
INTRAMUSCULAR | Status: AC
  Filled 2015-08-10: qty 2

## 2015-08-10 MED ORDER — CEFAZOLIN SODIUM-DEXTROSE 2-4 GM/100ML-% IV SOLN
2.0000 g | INTRAVENOUS | Status: AC
  Administered 2015-08-10: 2 g via INTRAVENOUS
  Filled 2015-08-10: qty 100

## 2015-08-10 MED ORDER — KETOROLAC TROMETHAMINE 30 MG/ML IJ SOLN
INTRAMUSCULAR | Status: AC
  Filled 2015-08-10: qty 1

## 2015-08-10 SURGICAL SUPPLY — 52 items
APL SKNCLS STERI-STRIP NONHPOA (GAUZE/BANDAGES/DRESSINGS) ×2
BENZOIN TINCTURE PRP APPL 2/3 (GAUZE/BANDAGES/DRESSINGS) ×4 IMPLANT
BLADE SURG 15 STRL LF DISP TIS (BLADE) ×2 IMPLANT
BLADE SURG 15 STRL SS (BLADE) ×4
BLADE SURG ROTATE 9660 (MISCELLANEOUS) ×2 IMPLANT
CHLORAPREP W/TINT 26ML (MISCELLANEOUS) ×4 IMPLANT
CLOSURE WOUND 1/2 X4 (GAUZE/BANDAGES/DRESSINGS) ×1
COVER SURGICAL LIGHT HANDLE (MISCELLANEOUS) ×4 IMPLANT
DRAIN PENROSE 1/2X12 LTX STRL (WOUND CARE) ×2 IMPLANT
DRAPE LAPAROSCOPIC ABDOMINAL (DRAPES) IMPLANT
DRAPE LAPAROTOMY TRNSV 102X78 (DRAPE) ×2 IMPLANT
DRAPE UTILITY XL STRL (DRAPES) ×8 IMPLANT
DRSG TEGADERM 4X4.75 (GAUZE/BANDAGES/DRESSINGS) ×4 IMPLANT
ELECT CAUTERY BLADE 6.4 (BLADE) ×4 IMPLANT
ELECT REM PT RETURN 9FT ADLT (ELECTROSURGICAL) ×4
ELECTRODE REM PT RTRN 9FT ADLT (ELECTROSURGICAL) ×2 IMPLANT
GAUZE SPONGE 4X4 12PLY STRL (GAUZE/BANDAGES/DRESSINGS) ×4 IMPLANT
GAUZE SPONGE 4X4 16PLY XRAY LF (GAUZE/BANDAGES/DRESSINGS) ×4 IMPLANT
GLOVE BIO SURGEON STRL SZ7 (GLOVE) ×4 IMPLANT
GLOVE BIOGEL PI IND STRL 7.0 (GLOVE) IMPLANT
GLOVE BIOGEL PI IND STRL 7.5 (GLOVE) ×2 IMPLANT
GLOVE BIOGEL PI INDICATOR 7.0 (GLOVE) ×4
GLOVE BIOGEL PI INDICATOR 7.5 (GLOVE) ×2
GLOVE SURG SS PI 6.5 STRL IVOR (GLOVE) ×2 IMPLANT
GLOVE SURG SS PI 7.0 STRL IVOR (GLOVE) ×2 IMPLANT
GOWN STRL REUS W/ TWL LRG LVL3 (GOWN DISPOSABLE) ×4 IMPLANT
GOWN STRL REUS W/TWL LRG LVL3 (GOWN DISPOSABLE) ×16
KIT BASIN OR (CUSTOM PROCEDURE TRAY) ×4 IMPLANT
KIT ROOM TURNOVER OR (KITS) ×4 IMPLANT
MESH PARIETEX PROGRIP LEFT (Mesh General) ×2 IMPLANT
NDL HYPO 25GX1X1/2 BEV (NEEDLE) ×2 IMPLANT
NEEDLE HYPO 25GX1X1/2 BEV (NEEDLE) ×4 IMPLANT
NS IRRIG 1000ML POUR BTL (IV SOLUTION) ×4 IMPLANT
PACK SURGICAL SETUP 50X90 (CUSTOM PROCEDURE TRAY) ×4 IMPLANT
PAD ARMBOARD 7.5X6 YLW CONV (MISCELLANEOUS) ×4 IMPLANT
PENCIL BUTTON HOLSTER BLD 10FT (ELECTRODE) ×4 IMPLANT
SPONGE GAUZE 4X4 12PLY STER LF (GAUZE/BANDAGES/DRESSINGS) ×2 IMPLANT
SPONGE INTESTINAL PEANUT (DISPOSABLE) ×4 IMPLANT
STRIP CLOSURE SKIN 1/2X4 (GAUZE/BANDAGES/DRESSINGS) ×3 IMPLANT
SUT MNCRL AB 4-0 PS2 18 (SUTURE) ×4 IMPLANT
SUT PDS AB 0 CT 36 (SUTURE) IMPLANT
SUT SILK 2 0 SH (SUTURE) IMPLANT
SUT SILK 3 0 (SUTURE)
SUT SILK 3-0 18XBRD TIE 12 (SUTURE) IMPLANT
SUT VIC AB 0 CT2 27 (SUTURE) ×4 IMPLANT
SUT VIC AB 2-0 SH 27 (SUTURE) ×4
SUT VIC AB 2-0 SH 27X BRD (SUTURE) ×2 IMPLANT
SUT VIC AB 3-0 SH 27 (SUTURE) ×4
SUT VIC AB 3-0 SH 27XBRD (SUTURE) ×2 IMPLANT
SYR CONTROL 10ML LL (SYRINGE) ×4 IMPLANT
TOWEL OR 17X24 6PK STRL BLUE (TOWEL DISPOSABLE) ×4 IMPLANT
TOWEL OR 17X26 10 PK STRL BLUE (TOWEL DISPOSABLE) ×4 IMPLANT

## 2015-08-10 NOTE — Anesthesia Procedure Notes (Addendum)
Anesthesia Regional Block:  TAP block  Pre-Anesthetic Checklist: ,, timeout performed, Correct Patient, Correct Site, Correct Laterality, Correct Procedure, Correct Position, site marked, Risks and benefits discussed,  Surgical consent,  Pre-op evaluation,  At surgeon's request and post-op pain management  Laterality: Left  Prep: chloraprep       Needles:   Needle Type: Echogenic Stimulator Needle     Needle Length: 10cm 10 cm Needle Gauge: 21 and 21 G    Additional Needles:  Procedures: ultrasound guided (picture in chart) TAP block Narrative:  Start time: 08/10/2015 8:51 AM End time: 08/10/2015 9:00 AM Injection made incrementally with aspirations every 5 mL.  Performed by: Personally  Anesthesiologist: Alexis Frock   Procedure Name: LMA Insertion Date/Time: 08/10/2015 9:32 AM Performed by: Myna Bright Pre-anesthesia Checklist: Patient identified, Emergency Drugs available, Suction available and Patient being monitored Patient Re-evaluated:Patient Re-evaluated prior to inductionOxygen Delivery Method: Circle system utilized Preoxygenation: Pre-oxygenation with 100% oxygen Intubation Type: IV induction LMA: LMA inserted LMA Size: 5.0 Tube type: Oral Number of attempts: 1 Placement Confirmation: positive ETCO2 and breath sounds checked- equal and bilateral Tube secured with: Tape Dental Injury: Teeth and Oropharynx as per pre-operative assessment

## 2015-08-10 NOTE — Discharge Instructions (Signed)
[facility] ° ° INGUINAL HERNIA REPAIR: POST OP INSTRUCTIONS ° °Always review your discharge instruction sheet given to you by the facility where your surgery was performed. °IF YOU HAVE DISABILITY OR FAMILY LEAVE FORMS, YOU MUST BRING THEM TO THE OFFICE FOR PROCESSING.   °DO NOT GIVE THEM TO YOUR DOCTOR. ° °1. A  prescription for pain medication may be given to you upon discharge.  Take your pain medication as prescribed, if needed.  If narcotic pain medicine is not needed, then you may take acetaminophen (Tylenol) or ibuprofen (Advil) as needed. °2. Take your usually prescribed medications unless otherwise directed. °3. If you need a refill on your pain medication, please contact your pharmacy.  They will contact our office to request authorization. Prescriptions will not be filled after 5 pm or on week-ends. °4. You should follow a light diet the first 24 hours after arrival home, such as soup and crackers, etc.  Be sure to include lots of fluids daily.  Resume your normal diet the day after surgery. °5. Most patients will experience some swelling and bruising around the umbilicus or in the groin and scrotum.  Ice packs and reclining will help.  Swelling and bruising can take several days to resolve.  °6. It is common to experience some constipation if taking pain medication after surgery.  Increasing fluid intake and taking a stool softener (such as Colace) will usually help or prevent this problem from occurring.  A mild laxative (Milk of Magnesia or Miralax) should be taken according to package directions if there are no bowel movements after 48 hours. °7. Unless discharge instructions indicate otherwise, you may remove your bandages 24-48 hours after surgery, and you may shower at that time.  You will have steri-strips (small skin tapes) in place directly over the incision.  These strips should be left on the skin for 7-10 days. °8. ACTIVITIES:  You may resume regular (light) daily activities  beginning the next day--such as daily self-care, walking, climbing stairs--gradually increasing activities as tolerated.  You may have sexual intercourse when it is comfortable.  Refrain from any heavy lifting or straining until approved by your doctor. °a. You may drive when you are no longer taking prescription pain medication, you can comfortably wear a seatbelt, and you can safely maneuver your car and apply brakes. °b. RETURN TO WORK:  2-3 weeks with light duty - no lifting over 15 lbs. °9. You should see your doctor in the office for a follow-up appointment approximately 2-3 weeks after your surgery.  Make sure that you call for this appointment within a day or two after you arrive home to insure a convenient appointment time. °10. OTHER INSTRUCTIONS:  __________________________________________________________________________________________________________________________________________________________________________________________  °WHEN TO CALL YOUR DOCTOR: °1. Fever over 101.0 °2. Inability to urinate °3. Nausea and/or vomiting °4. Extreme swelling or bruising °5. Continued bleeding from incision. °6. Increased pain, redness, or drainage from the incision ° °The clinic staff is available to answer your questions during regular business hours.  Please don’t hesitate to call and ask to speak to one of the nurses for clinical concerns.  If you have a medical emergency, go to the nearest emergency room or call 911.  A surgeon from [facility] is always on call at the hospital ° ° °[address], [address], [place], [place]  [address] ? ° [address] [address], [place], [place]   [address] °[phone]    [phone]    FAX [phone] °Web site:   [email or web address] ° ° °

## 2015-08-10 NOTE — Transfer of Care (Signed)
Immediate Anesthesia Transfer of Care Note  Patient: Adrian Daniels  Procedure(s) Performed: Procedure(s): LEFT INGUINAL HERNIA REPAIR WITH MESH (Left) INSERTION OF MESH (Left)  Patient Location: PACU  Anesthesia Type:GA combined with regional for post-op pain  Level of Consciousness: awake, alert , oriented and patient cooperative  Airway & Oxygen Therapy: Patient Spontanous Breathing and Patient connected to nasal cannula oxygen  Post-op Assessment: Report given to RN, Post -op Vital signs reviewed and stable and Patient moving all extremities  Post vital signs: Reviewed and stable  Last Vitals:  Filed Vitals:   08/10/15 0900 08/10/15 0905  BP: 141/74 140/68  Pulse: 79 85  Temp:    Resp: 11 14    Complications: No apparent anesthesia complications

## 2015-08-10 NOTE — Interval H&P Note (Signed)
History and Physical Interval Note:  08/10/2015 9:01 AM  Adrian Daniels  has presented today for surgery, with the diagnosis of Left inguinal hernia  The various methods of treatment have been discussed with the patient and family. After consideration of risks, benefits and other options for treatment, the patient has consented to  Procedure(s): LEFT INGUINAL HERNIA REPAIR WITH MESH (Left) INSERTION OF MESH (Left) as a surgical intervention .  The patient's history has been reviewed, patient examined, no change in status, stable for surgery.  I have reviewed the patient's chart and labs.  Questions were answered to the patient's satisfaction.     [name]

## 2015-08-10 NOTE — Op Note (Signed)
Hernia, Open, Procedure Note  Indications: The patient presented with a history of a left, reducible inguinal hernia.    Pre-operative Diagnosis: left reducible inguinal hernia Post-operative Diagnosis: same  Surgeon: Maia Petties.   Assistants: none  Anesthesia: General endotracheal anesthesia  ASA Class: 2  Procedure Details  The patient was seen again in the Holding Room. The risks, benefits, complications, treatment options, and expected outcomes were discussed with the patient. The possibilities of reaction to medication, pulmonary aspiration, perforation of viscus, bleeding, recurrent infection, the need for additional procedures, and development of a complication requiring transfusion or further operation were discussed with the patient and/or family. The likelihood of success in repairing the hernia and returning the patient to their previous functional status is good.  There was concurrence with the proposed plan, and informed consent was obtained. The site of surgery was properly noted/marked. The patient was taken to the Operating Room, identified as Adrian Daniels, and the procedure verified as left inguinal hernia repair. A Time Out was held and the above information confirmed.  The patient was placed in the supine position and underwent induction of anesthesia. The lower abdomen and groin was prepped with Chloraprep and draped in the standard fashion, and 0.25% Marcaine with epinephrine was used to anesthetize the skin over the mid-portion of the inguinal canal. An oblique incision was made. Dissection was carried down through the subcutaneous tissue with cautery to the external oblique fascia.  We opened the external oblique fascia along the direction of its fibers to the external ring.  The spermatic cord was circumferentially dissected bluntly and retracted with a Penrose drain.  The ilioinguinal nerve was identified and preserved.  The floor of the inguinal canal was inspected  and showed a direct defect.  We skeletonized the spermatic cord and reduced a moderate-sized indirect hernia sac.  The floor of the inguinal canal was closed with 0 vicryl and we tightened the internal ring with 0 vicryl.  We used a left-sided Progrip mesh which was inserted and deployed across the floor of the inguinal canal. The mesh was tucked underneath the external oblique fascia laterally.  The flap of the mesh was closed around the spermatic cord with 0 Vicryl to recreate the internal inguinal ring.  The mesh was secured to the pubic tubercle with 0 Vicryl.  Another suture was used to fix the lower edge of the mesh to the shelving edge. The external oblique fascia was reapproximated with 2-0 Vicryl.  3-0 Vicryl was used to close the subcutaneous tissues and 4-0 Monocryl was used to close the skin in subcuticular fashion.  Benzoin and steri-strips were used to seal the incision.  A clean dressing was applied.  The patient was then extubated and brought to the recovery room in stable condition.  All sponge, instrument, and needle counts were correct prior to closure and at the conclusion of the case.   Estimated Blood Loss: Minimal                 Complications: None; patient tolerated the procedure well.         Disposition: PACU - hemodynamically stable.         Condition: stable  Imogene Burn. Georgette Dover, MD, [facility]  General/ Trauma Surgery  08/10/2015 10:35 AM

## 2015-08-10 NOTE — H&P (View-Only) (Signed)
History of Present Illness Adrian Daniels. Adrian Seier MD; 07/13/2015 3:01 PM) The patient is a 81 year old male who presents with an inguinal hernia. PCP - [facility] - Einar Gip  This is a 81 year old male who presents with several months of a visible palpable bulge in his left groin. This has become mildly enlarged and causes some minimal discomfort. The patient has not tried to reduce this area. He denies any GI obstructive symptoms. He was examined by Dr. Dorina Hoyer who noted a reducible left inguinal hernia.  He is anticoagulated for atrial fibrillation. He saw Dr. Einar Gip about 1 month ago.   Other Problems Erasmo Leventhal, RN, BSN; 07/13/2015 2:19 PM) High blood pressure Umbilical Hernia Repair  Past Surgical History Erasmo Leventhal, RN, BSN; 07/13/2015 2:19 PM) No pertinent past surgical history  Diagnostic Studies History Erasmo Leventhal, RN, BSN; 07/13/2015 2:19 PM) Colonoscopy 1-5 years ago  Allergies Erasmo Leventhal, RN, BSN; 07/13/2015 2:19 PM) No Known Drug Allergies 07/13/2015  Medication History (Erasmo Leventhal, RN, BSN; 07/13/2015 2:21 PM) DiltiaZEM HCl ER Beads (300MG  Capsule ER 24HR, Oral daily) Active. Pravastatin Sodium (80MG  Tablet, Oral daily) Active. Valsartan-Hydrochlorothiazide (320-12.5MG  Tablet, Oral daily) Active. Savaysa (60MG  Tablet, Oral daily) Active. Claritin (10MG  Tablet, Oral as needed) Active. Medications Reconciled  Social History (Erasmo Leventhal, RN, BSN; 07/13/2015 2:19 PM) Alcohol use Occasional alcohol use. Caffeine use Carbonated beverages, Coffee, Tea. No drug use Tobacco use Never smoker.  Family History (Erasmo Leventhal, RN, BSN; 07/13/2015 2:19 PM) Diabetes Mellitus Daughter. Kidney Disease Daughter.     Review of Systems Occupational hygienist, BSN; 07/13/2015 2:19 PM) General Not Present- Appetite Loss, Chills, Fatigue, Fever, Night Sweats, Weight Gain and Weight Loss. Skin Not Present- Change in Wart/Mole, Dryness,  Hives, Jaundice, New Lesions, Non-Healing Wounds, Rash and Ulcer. HEENT Present- Wears glasses/contact lenses. Not Present- Earache, Hearing Loss, Hoarseness, Nose Bleed, Oral Ulcers, Ringing in the Ears, Seasonal Allergies, Sinus Pain, Sore Throat, Visual Disturbances and Yellow Eyes. Respiratory Not Present- Bloody sputum, Chronic Cough, Difficulty Breathing, Snoring and Wheezing. Breast Not Present- Breast Mass, Breast Pain, Nipple Discharge and Skin Changes. Male Genitourinary Present- Nocturia. Not Present- Blood in Urine, Change in Urinary Stream, Frequency, Impotence, Painful Urination, Urgency and Urine Leakage. Musculoskeletal Not Present- Back Pain, Joint Pain, Joint Stiffness, Muscle Pain, Muscle Weakness and Swelling of Extremities. Neurological Not Present- Decreased Memory, Fainting, Headaches, Numbness, Seizures, Tingling, Tremor, Trouble walking and Weakness. Psychiatric Not Present- Anxiety, Bipolar, Change in Sleep Pattern, Depression, Fearful and Frequent crying. Endocrine Not Present- Cold Intolerance, Excessive Hunger, Hair Changes, Heat Intolerance, Hot flashes and New Diabetes. Hematology Not Present- Easy Bruising, Excessive bleeding, Gland problems, HIV and Persistent Infections.  Vitals Occupational hygienist, BSN; 07/13/2015 2:19 PM) 07/13/2015 2:19 PM Weight: 189.6 lb Height: 72in Body Surface Area: 2.08 m Body Mass Index: 25.71 kg/m  Temp.: 98.68F(Oral)  Pulse: 74 (Regular)  BP: 148/90 (Sitting, Left Arm, Standard)      Physical Exam Rodman Key K. Sparkle Aube MD; 07/13/2015 3:02 PM)  The physical exam findings are as follows: Note:WDWN in NAD HEENT: EOMI, sclera anicteric Neck: No masses, no thyromegaly Lungs: CTA bilaterally; normal respiratory effort CV: Regular rate and rhythm; no murmurs Abd: +bowel sounds, soft, non-tender, no masses GU; bilateral descended testes; no testicular masses; visible palpable reducible left inguinal hernia; no right  inguinal hernia Ext: Well-perfused; no edema Skin: Warm, dry; no sign of jaundice    Assessment & Plan Rodman Key K. Elnoria Livingston MD; 07/13/2015 2:42 PM)  LEFT INGUINAL HERNIA (K40.90)  Current Plans  Schedule for Surgery - Left inguinal hernia repair with mesh. The surgical procedure has been discussed with the patient. Potential risks, benefits, alternative treatments, and expected outcomes have been explained. All of the patient's questions at this time have been answered. The likelihood of reaching the patient's treatment goal is good. The patient understand the proposed surgical procedure and wishes to proceed.  Cardiac clearance first by Dr. Sheryle Hail. Georgette Dover, MD, [facility]  General/ Trauma Surgery  07/13/2015 3:04 PM

## 2015-08-10 NOTE — Anesthesia Postprocedure Evaluation (Signed)
Anesthesia Post Note  Patient: Elyn Aquas Alarid  Procedure(s) Performed: Procedure(s) (LRB): LEFT INGUINAL HERNIA REPAIR WITH MESH (Left) INSERTION OF MESH (Left)  Patient location during evaluation: PACU Anesthesia Type: General Level of consciousness: awake and alert Pain management: pain level controlled Vital Signs Assessment: post-procedure vital signs reviewed and stable Respiratory status: spontaneous breathing, nonlabored ventilation, respiratory function stable and patient connected to nasal cannula oxygen Cardiovascular status: blood pressure returned to baseline and stable Postop Assessment: no signs of nausea or vomiting Anesthetic complications: no    Last Vitals:  Filed Vitals:   08/10/15 1034 08/10/15 1050  BP: 112/73 125/75  Pulse:    Temp: 36.2 C   Resp: 9     Last Pain:  Filed Vitals:   08/10/15 1059  PainSc: 0-No pain                 Alexis Frock

## 2015-08-11 ENCOUNTER — Encounter (HOSPITAL_COMMUNITY): Payer: Self-pay | Admitting: Surgery

## 2015-10-01 DIAGNOSIS — Z7901 Long term (current) use of anticoagulants: Secondary | ICD-10-CM | POA: Diagnosis not present

## 2015-10-01 DIAGNOSIS — I10 Essential (primary) hypertension: Secondary | ICD-10-CM | POA: Diagnosis not present

## 2015-10-01 DIAGNOSIS — I4891 Unspecified atrial fibrillation: Secondary | ICD-10-CM | POA: Diagnosis not present

## 2015-10-01 DIAGNOSIS — I739 Peripheral vascular disease, unspecified: Secondary | ICD-10-CM | POA: Diagnosis not present

## 2015-10-04 DIAGNOSIS — E7800 Pure hypercholesterolemia, unspecified: Secondary | ICD-10-CM | POA: Diagnosis not present

## 2015-10-04 DIAGNOSIS — Z7982 Long term (current) use of aspirin: Secondary | ICD-10-CM | POA: Diagnosis not present

## 2015-10-04 DIAGNOSIS — I10 Essential (primary) hypertension: Secondary | ICD-10-CM | POA: Diagnosis not present

## 2015-10-05 DIAGNOSIS — D649 Anemia, unspecified: Secondary | ICD-10-CM | POA: Diagnosis not present

## 2015-10-06 ENCOUNTER — Ambulatory Visit
Admission: RE | Admit: 2015-10-06 | Discharge: 2015-10-06 | Disposition: A | Discharge status: Home / Self Care | Payer: Medicare Other | Source: Ambulatory Visit | Attending: Internal Medicine | Admitting: Internal Medicine

## 2015-10-06 ENCOUNTER — Other Ambulatory Visit: Payer: Self-pay | Admitting: Internal Medicine

## 2015-10-06 DIAGNOSIS — E7800 Pure hypercholesterolemia, unspecified: Secondary | ICD-10-CM | POA: Diagnosis not present

## 2015-10-06 DIAGNOSIS — I82402 Acute embolism and thrombosis of unspecified deep veins of left lower extremity: Secondary | ICD-10-CM

## 2015-10-06 DIAGNOSIS — I10 Essential (primary) hypertension: Secondary | ICD-10-CM | POA: Diagnosis not present

## 2015-10-06 DIAGNOSIS — I80292 Phlebitis and thrombophlebitis of other deep vessels of left lower extremity: Secondary | ICD-10-CM | POA: Diagnosis not present

## 2015-11-01 DIAGNOSIS — R351 Nocturia: Secondary | ICD-10-CM | POA: Diagnosis not present

## 2015-11-01 DIAGNOSIS — R3121 Asymptomatic microscopic hematuria: Secondary | ICD-10-CM | POA: Diagnosis not present

## 2015-11-01 DIAGNOSIS — R972 Elevated prostate specific antigen [PSA]: Secondary | ICD-10-CM | POA: Diagnosis not present

## 2015-11-01 DIAGNOSIS — N401 Enlarged prostate with lower urinary tract symptoms: Secondary | ICD-10-CM | POA: Diagnosis not present

## 2016-04-05 DIAGNOSIS — I10 Essential (primary) hypertension: Secondary | ICD-10-CM | POA: Diagnosis not present

## 2016-04-10 DIAGNOSIS — Z125 Encounter for screening for malignant neoplasm of prostate: Secondary | ICD-10-CM | POA: Diagnosis not present

## 2016-04-10 DIAGNOSIS — I10 Essential (primary) hypertension: Secondary | ICD-10-CM | POA: Diagnosis not present

## 2016-04-10 DIAGNOSIS — I129 Hypertensive chronic kidney disease with stage 1 through stage 4 chronic kidney disease, or unspecified chronic kidney disease: Secondary | ICD-10-CM | POA: Diagnosis not present

## 2016-04-10 DIAGNOSIS — Z7982 Long term (current) use of aspirin: Secondary | ICD-10-CM | POA: Diagnosis not present

## 2016-04-10 DIAGNOSIS — Z7901 Long term (current) use of anticoagulants: Secondary | ICD-10-CM | POA: Diagnosis not present

## 2016-04-10 DIAGNOSIS — E7800 Pure hypercholesterolemia, unspecified: Secondary | ICD-10-CM | POA: Diagnosis not present

## 2016-04-10 DIAGNOSIS — I482 Chronic atrial fibrillation: Secondary | ICD-10-CM | POA: Diagnosis not present

## 2016-04-11 DIAGNOSIS — Z23 Encounter for immunization: Secondary | ICD-10-CM | POA: Diagnosis not present

## 2016-04-11 DIAGNOSIS — Z0000 Encounter for general adult medical examination without abnormal findings: Secondary | ICD-10-CM | POA: Diagnosis not present

## 2016-04-14 DIAGNOSIS — D61818 Other pancytopenia: Secondary | ICD-10-CM | POA: Diagnosis not present

## 2016-04-14 DIAGNOSIS — N401 Enlarged prostate with lower urinary tract symptoms: Secondary | ICD-10-CM | POA: Diagnosis not present

## 2016-04-14 DIAGNOSIS — I4891 Unspecified atrial fibrillation: Secondary | ICD-10-CM | POA: Diagnosis not present

## 2016-04-14 DIAGNOSIS — E7800 Pure hypercholesterolemia, unspecified: Secondary | ICD-10-CM | POA: Diagnosis not present

## 2016-04-20 DIAGNOSIS — I4891 Unspecified atrial fibrillation: Secondary | ICD-10-CM | POA: Diagnosis not present

## 2016-04-20 DIAGNOSIS — I10 Essential (primary) hypertension: Secondary | ICD-10-CM | POA: Diagnosis not present

## 2016-06-05 DIAGNOSIS — N401 Enlarged prostate with lower urinary tract symptoms: Secondary | ICD-10-CM | POA: Diagnosis not present

## 2016-06-05 DIAGNOSIS — R3121 Asymptomatic microscopic hematuria: Secondary | ICD-10-CM | POA: Diagnosis not present

## 2016-06-05 DIAGNOSIS — R351 Nocturia: Secondary | ICD-10-CM | POA: Diagnosis not present

## 2016-06-05 DIAGNOSIS — R972 Elevated prostate specific antigen [PSA]: Secondary | ICD-10-CM | POA: Diagnosis not present

## 2016-08-29 DIAGNOSIS — N529 Male erectile dysfunction, unspecified: Secondary | ICD-10-CM | POA: Diagnosis not present

## 2016-08-29 DIAGNOSIS — J029 Acute pharyngitis, unspecified: Secondary | ICD-10-CM | POA: Diagnosis not present

## 2016-08-29 DIAGNOSIS — J040 Acute laryngitis: Secondary | ICD-10-CM | POA: Diagnosis not present

## 2016-09-07 DIAGNOSIS — R972 Elevated prostate specific antigen [PSA]: Secondary | ICD-10-CM | POA: Diagnosis not present

## 2016-09-13 DIAGNOSIS — N5201 Erectile dysfunction due to arterial insufficiency: Secondary | ICD-10-CM | POA: Diagnosis not present

## 2016-09-13 DIAGNOSIS — R972 Elevated prostate specific antigen [PSA]: Secondary | ICD-10-CM | POA: Diagnosis not present

## 2016-09-13 DIAGNOSIS — R3121 Asymptomatic microscopic hematuria: Secondary | ICD-10-CM | POA: Diagnosis not present

## 2016-09-13 DIAGNOSIS — N401 Enlarged prostate with lower urinary tract symptoms: Secondary | ICD-10-CM | POA: Diagnosis not present

## 2016-09-13 DIAGNOSIS — R351 Nocturia: Secondary | ICD-10-CM | POA: Diagnosis not present

## 2016-10-11 DIAGNOSIS — I10 Essential (primary) hypertension: Secondary | ICD-10-CM | POA: Diagnosis not present

## 2016-10-11 DIAGNOSIS — Z7901 Long term (current) use of anticoagulants: Secondary | ICD-10-CM | POA: Diagnosis not present

## 2016-10-11 DIAGNOSIS — I129 Hypertensive chronic kidney disease with stage 1 through stage 4 chronic kidney disease, or unspecified chronic kidney disease: Secondary | ICD-10-CM | POA: Diagnosis not present

## 2016-10-11 DIAGNOSIS — I482 Chronic atrial fibrillation: Secondary | ICD-10-CM | POA: Diagnosis not present

## 2016-10-12 DIAGNOSIS — M545 Low back pain: Secondary | ICD-10-CM | POA: Diagnosis not present

## 2016-10-18 DIAGNOSIS — I482 Chronic atrial fibrillation: Secondary | ICD-10-CM | POA: Diagnosis not present

## 2017-02-22 ENCOUNTER — Encounter (HOSPITAL_COMMUNITY): Payer: Self-pay | Admitting: *Deleted

## 2017-02-22 ENCOUNTER — Ambulatory Visit (HOSPITAL_COMMUNITY)
Admission: EM | Admit: 2017-02-22 | Discharge: 2017-02-22 | Disposition: A | Discharge status: Home / Self Care | Payer: Medicare Other | Attending: Family Medicine | Admitting: Family Medicine

## 2017-02-22 DIAGNOSIS — T63304A Toxic effect of unspecified spider venom, undetermined, initial encounter: Secondary | ICD-10-CM

## 2017-02-22 NOTE — Discharge Instructions (Signed)
Keep the spot clean with soap and water daily. Watch for any signs of infection such as large area of deep redness, tenderness, pus or drainage and swelling. He may obtain a small area of redness and itching. If this is the case this is a natural reaction of the skin to what is considered an allergen or allergy to the bite. If he gets sick, have pain, fever or chills, abdominal pain or other problems he would need to go to the emergency department. Read the accompanying instructions regarding spider bites.

## 2017-02-22 NOTE — ED Provider Notes (Signed)
[facility]    CSN: [id number] Arrival date & time: 02/22/17  1557     History   Chief Complaint Chief Complaint  Patient presents with  . Insect Bite    HPI Adrian Daniels is a 82 y.o. male.   82 year old male states that he saw a spider on the back of his left leg about an hour and half ago. He states it and he knocked it off and killed. He was sure it was a spider. This created a single pinpoint lesion that appears much as a scab. There is no bleeding or drainage. No surrounding tenderness, erythema. Patient denies systemic symptoms and is completely asymptomatic.      Past Medical History:  Diagnosis Date  . Anemia, unspecified   . Atrial fibrillation ([place])   . Dysrhythmia   . Hemorrhoids   . History of colon polyps 05-28-2007   Adenomatous polyp(Colonoscopy with Dr. Lajoyce Corners)  . Hx of adenomatous colonic polyps 02/24/2003   2004,2006,2009 Dr. Lajoyce Corners  . Hyperlipemia   . Hypertension   . Internal hemorrhoids, bleeding and Grade 3 prolapse 12/09/2014  . Wears dentures     Patient Active Problem List   Diagnosis Date Noted  . Internal hemorrhoids, bleeding and Grade 3 prolapse 12/09/2014  . Atrial fibrillation ([place]) 12/09/2014  . Hx of adenomatous colonic polyps 02/24/2003    Past Surgical History:  Procedure Laterality Date  . CARDIOVERSION N/A 03/02/2015   Procedure: CARDIOVERSION;  Surgeon: Adrian Prows, MD;  Location: [facility];  Service: Cardiovascular;  Laterality: N/A;  . COLONOSCOPY  2009  . HEMORRHOID BANDING  2016  . INGUINAL HERNIA REPAIR Left 08/10/2015   Procedure: LEFT INGUINAL HERNIA REPAIR WITH MESH;  Surgeon: Donnie Mesa, MD;  Location: [facility];  Service: General;  Laterality: Left;  . INSERTION OF MESH Left 08/10/2015   Procedure: INSERTION OF MESH;  Surgeon: Donnie Mesa, MD;  Location: [facility];  Service: General;  Laterality: Left;  Marland Kitchen MULTIPLE TOOTH EXTRACTIONS         Home Medications    Prior to Admission medications     Medication Sig Start Date End Date Taking? Authorizing Provider  diltiazem (CARDIZEM CD) 300 MG 24 hr capsule Take 300 mg by mouth daily.    [provider]  edoxaban (SAVAYSA) 60 MG TABS tablet Take 60 mg by mouth daily.    [provider]  HYDROcodone-acetaminophen (NORCO/VICODIN) 5-325 MG tablet Take 1 tablet by mouth every 4 (four) hours as needed. 08/10/15   Donnie Mesa, MD  hydrocortisone (ANUSOL-HC) 2.5 % rectal cream Place 1 application rectally daily as needed for hemorrhoids or itching.    [provider]  loratadine (CLARITIN) 10 MG tablet Take 10 mg by mouth daily as needed for allergies.     [provider]  pravastatin (PRAVACHOL) 80 MG tablet Take 80 mg by mouth daily. 04/30/15   [provider]  traMADol (ULTRAM) 50 MG tablet Take 1 tablet (50 mg total) by mouth every 6 (six) hours as needed. 02/05/14   Ernestina Patches, MD  valsartan-hydrochlorothiazide (DIOVAN-HCT) 320-12.5 MG per tablet Take 1 tablet by mouth daily. 01/11/15   [provider]    Family History Family History  Problem Relation Age of Onset  . Diabetes Daughter   . Cervical cancer Sister   . Colon cancer Neg Hx   . Colon polyps Neg Hx     Social History Social History  Substance Use Topics  . Smoking status: Former Research scientist (life sciences)  .  Smokeless tobacco: Never Used  . Alcohol use 2.0 oz/week    4 Standard drinks or equivalent per week     Comment: 4 per week     Allergies   Patient has no known allergies.   Review of Systems Review of Systems  Constitutional: Negative.   Respiratory: Negative.   Gastrointestinal: Negative.   Musculoskeletal: Negative.   Neurological: Negative.   Psychiatric/Behavioral: Negative.   All other systems reviewed and are negative.    Physical Exam Triage Vital Signs ED Triage Vitals [02/22/17 1621]  Enc Vitals Group     BP (!) 157/116     Pulse Rate 90     Resp 16     Temp 98.2 F (36.8 C)     Temp Source  Oral     SpO2 95 %     Weight 195 lb (88.5 kg)     Height 5\' 11"  (1.803 m)     Head Circumference      Peak Flow      Pain Score      Pain Loc      Pain Edu?      Excl. in [place]?    No data found.   Updated Vital Signs BP (!) 157/116 (BP Location: Left Arm)   Pulse 90   Temp 98.2 F (36.8 C) (Oral)   Resp 16   Ht 5\' 11"  (1.803 m)   Wt 195 lb (88.5 kg)   SpO2 95%   BMI 27.20 kg/m   Visual Acuity Right Eye Distance:   Left Eye Distance:   Bilateral Distance:    Right Eye Near:   Left Eye Near:    Bilateral Near:     Physical Exam  Constitutional: He is oriented to person, place, and time. He appears well-developed and well-nourished. No distress.  Eyes: EOM are normal.  Neck: Normal range of motion. Neck supple.  Cardiovascular: Normal rate.   Pulmonary/Chest: Effort normal. No respiratory distress.  Musculoskeletal: He exhibits no edema.  Neurological: He is alert and oriented to person, place, and time. He exhibits normal muscle tone.  Skin: Skin is warm and dry.  Other than a pinpoint area that appears to be a flesh-colored scab no evidence of bite marks were fang marks or surrounding skin changes.  Psychiatric: He has a normal mood and affect.  Nursing note and vitals reviewed.    UC Treatments / Results  Labs (all labs ordered are listed, but only abnormal results are displayed) Labs Reviewed - No data to display  EKG  EKG Interpretation None       Radiology No results found.  Procedures Procedures (including critical care time)  Medications Ordered in UC Medications - No data to display   Initial Impression / Assessment and Plan / UC Course  I have reviewed the triage vital signs and the nursing notes.  Pertinent labs & imaging results that were available during my care of the patient were reviewed by me and considered in my medical decision making (see chart for details).    Keep the spot clean with soap and water daily. Watch for any  signs of infection such as large area of deep redness, tenderness, pus or drainage and swelling. He may obtain a small area of redness and itching. If this is the case this is a natural reaction of the skin to what is considered an allergen or allergy to the bite. If he gets sick, have pain, fever or chills, abdominal pain or  other problems he would need to go to the emergency department. Read the accompanying instructions regarding spider bites.    Final Clinical Impressions(s) / UC Diagnoses   Final diagnoses:  Spider bite wound, undetermined intent, initial encounter    New Prescriptions New Prescriptions   No medications on file     Controlled Substance Prescriptions [place] Controlled Substance Registry consulted? Not Applicable   Janne Napoleon, NP 02/22/17 1711

## 2017-02-22 NOTE — ED Triage Notes (Signed)
Patient reports being bite by sider about a hour ago. Unable to find the location of bite on his left posterior calf where he says the bite was.

## 2017-03-16 DIAGNOSIS — Z23 Encounter for immunization: Secondary | ICD-10-CM | POA: Diagnosis not present

## 2017-03-22 DIAGNOSIS — N5201 Erectile dysfunction due to arterial insufficiency: Secondary | ICD-10-CM | POA: Diagnosis not present

## 2017-03-22 DIAGNOSIS — R972 Elevated prostate specific antigen [PSA]: Secondary | ICD-10-CM | POA: Diagnosis not present

## 2017-03-22 DIAGNOSIS — R351 Nocturia: Secondary | ICD-10-CM | POA: Diagnosis not present

## 2017-03-22 DIAGNOSIS — N401 Enlarged prostate with lower urinary tract symptoms: Secondary | ICD-10-CM | POA: Diagnosis not present

## 2017-04-12 DIAGNOSIS — J40 Bronchitis, not specified as acute or chronic: Secondary | ICD-10-CM | POA: Diagnosis not present

## 2017-04-12 DIAGNOSIS — Z7982 Long term (current) use of aspirin: Secondary | ICD-10-CM | POA: Diagnosis not present

## 2017-04-12 DIAGNOSIS — I10 Essential (primary) hypertension: Secondary | ICD-10-CM | POA: Diagnosis not present

## 2017-04-12 DIAGNOSIS — Z0000 Encounter for general adult medical examination without abnormal findings: Secondary | ICD-10-CM | POA: Diagnosis not present

## 2017-04-12 DIAGNOSIS — J0100 Acute maxillary sinusitis, unspecified: Secondary | ICD-10-CM | POA: Diagnosis not present

## 2017-04-13 DIAGNOSIS — D649 Anemia, unspecified: Secondary | ICD-10-CM | POA: Diagnosis not present

## 2017-04-18 DIAGNOSIS — N401 Enlarged prostate with lower urinary tract symptoms: Secondary | ICD-10-CM | POA: Diagnosis not present

## 2017-04-18 DIAGNOSIS — Z7982 Long term (current) use of aspirin: Secondary | ICD-10-CM | POA: Diagnosis not present

## 2017-04-18 DIAGNOSIS — D696 Thrombocytopenia, unspecified: Secondary | ICD-10-CM | POA: Diagnosis not present

## 2017-04-18 DIAGNOSIS — R3129 Other microscopic hematuria: Secondary | ICD-10-CM | POA: Diagnosis not present

## 2017-04-18 DIAGNOSIS — D61818 Other pancytopenia: Secondary | ICD-10-CM | POA: Diagnosis not present

## 2017-04-18 DIAGNOSIS — I4891 Unspecified atrial fibrillation: Secondary | ICD-10-CM | POA: Diagnosis not present

## 2017-04-18 DIAGNOSIS — I482 Chronic atrial fibrillation: Secondary | ICD-10-CM | POA: Diagnosis not present

## 2017-04-18 DIAGNOSIS — E7800 Pure hypercholesterolemia, unspecified: Secondary | ICD-10-CM | POA: Diagnosis not present

## 2017-04-18 DIAGNOSIS — K648 Other hemorrhoids: Secondary | ICD-10-CM | POA: Diagnosis not present

## 2017-04-18 DIAGNOSIS — I10 Essential (primary) hypertension: Secondary | ICD-10-CM | POA: Diagnosis not present

## 2017-04-18 DIAGNOSIS — K625 Hemorrhage of anus and rectum: Secondary | ICD-10-CM | POA: Diagnosis not present

## 2017-04-18 DIAGNOSIS — D72819 Decreased white blood cell count, unspecified: Secondary | ICD-10-CM | POA: Diagnosis not present

## 2017-05-07 ENCOUNTER — Ambulatory Visit (HOSPITAL_COMMUNITY)
Admission: EM | Admit: 2017-05-07 | Discharge: 2017-05-07 | Disposition: A | Discharge status: Home / Self Care | Payer: Medicare Other | Attending: Internal Medicine | Admitting: Internal Medicine

## 2017-05-07 ENCOUNTER — Encounter (HOSPITAL_COMMUNITY): Payer: Self-pay | Admitting: Emergency Medicine

## 2017-05-07 DIAGNOSIS — J0140 Acute pansinusitis, unspecified: Secondary | ICD-10-CM

## 2017-05-07 MED ORDER — FLUTICASONE PROPIONATE 50 MCG/ACT NA SUSP: 1.0000 | Freq: Every day | NASAL | 2 refills | Status: DC

## 2017-05-07 MED ORDER — DOXYCYCLINE HYCLATE 100 MG PO CAPS: 100.0000 mg | ORAL_CAPSULE | Freq: Two times a day (BID) | ORAL | 0 refills | Status: AC

## 2017-05-07 NOTE — ED Triage Notes (Signed)
PT C/O: cold sx associated w/prod cough, nasal congsetion  ONSET: 2 weeks  DENIES: fevers  TAKING MEDS: OTC cold meds  A&O x4... NAD... Ambulatory

## 2017-05-07 NOTE — Discharge Instructions (Signed)
Push fluids to ensure adequate hydration and keep secretions thin.  Tylenol as needed for pain or fevers. Daily nasal spray. Complete course of antibiotics. If symptoms worsen or do not improve in the next week to return to be seen or to follow up with PCP.

## 2017-05-07 NOTE — ED Provider Notes (Signed)
[facility]    CSN: [id number] Arrival date & time: 05/07/17  1450     History   Chief Complaint Chief Complaint  Patient presents with  . URI    HPI Adrian Daniels is a 82 y.o. male.   Adrian Daniels presents with complaints of congestion and cough which has been ongoing for 2 weeks and he feels like worsening. Without fevers, chest pain or shortness of breath. Has had a few chills. Denies gi/gu complaints. Without ear pain or sore throat. Denies gi/gu complaints. Has tried some OTC treatments which have not helped. History of hypertension, afib, hyperlipidemia.    ROS per HPI.       Past Medical History:  Diagnosis Date  . Anemia, unspecified   . Atrial fibrillation ([place])   . Dysrhythmia   . Hemorrhoids   . History of colon polyps 05-28-2007   Adenomatous polyp(Colonoscopy with Dr. Lajoyce Corners)  . Hx of adenomatous colonic polyps 02/24/2003   2004,2006,2009 Dr. Lajoyce Corners  . Hyperlipemia   . Hypertension   . Internal hemorrhoids, bleeding and Grade 3 prolapse 12/09/2014  . Wears dentures     Patient Active Problem List   Diagnosis Date Noted  . Internal hemorrhoids, bleeding and Grade 3 prolapse 12/09/2014  . Atrial fibrillation ([place]) 12/09/2014  . Hx of adenomatous colonic polyps 02/24/2003    Past Surgical History:  Procedure Laterality Date  . CARDIOVERSION N/A 03/02/2015   Procedure: CARDIOVERSION;  Surgeon: Adrian Prows, MD;  Location: [facility];  Service: Cardiovascular;  Laterality: N/A;  . COLONOSCOPY  2009  . HEMORRHOID BANDING  2016  . INGUINAL HERNIA REPAIR Left 08/10/2015   Procedure: LEFT INGUINAL HERNIA REPAIR WITH MESH;  Surgeon: Donnie Mesa, MD;  Location: [facility];  Service: General;  Laterality: Left;  . INSERTION OF MESH Left 08/10/2015   Procedure: INSERTION OF MESH;  Surgeon: Donnie Mesa, MD;  Location: [facility];  Service: General;  Laterality: Left;  Marland Kitchen MULTIPLE TOOTH EXTRACTIONS         Home Medications    Prior to Admission medications     Medication Sig Start Date End Date Taking? Authorizing Provider  diltiazem (CARDIZEM CD) 300 MG 24 hr capsule Take 300 mg by mouth daily.   Yes [provider]  edoxaban (SAVAYSA) 60 MG TABS tablet Take 60 mg by mouth daily.   Yes [provider]  loratadine (CLARITIN) 10 MG tablet Take 10 mg by mouth daily as needed for allergies.    Yes [provider]  pravastatin (PRAVACHOL) 80 MG tablet Take 80 mg by mouth daily. 04/30/15  Yes [provider]  traMADol (ULTRAM) 50 MG tablet Take 1 tablet (50 mg total) by mouth every 6 (six) hours as needed. 02/05/14  Yes Ernestina Patches, MD  valsartan-hydrochlorothiazide (DIOVAN-HCT) 320-12.5 MG per tablet Take 1 tablet by mouth daily. 01/11/15  Yes [provider]  doxycycline (VIBRAMYCIN) 100 MG capsule Take 1 capsule (100 mg total) by mouth 2 (two) times daily for 7 days. 05/07/17 05/14/17  Zigmund Gottron, NP  fluticasone (FLONASE) 50 MCG/ACT nasal spray Place 1 spray into both nostrils daily. 05/07/17   Zigmund Gottron, NP  HYDROcodone-acetaminophen (NORCO/VICODIN) 5-325 MG tablet Take 1 tablet by mouth every 4 (four) hours as needed. 08/10/15   Donnie Mesa, MD  hydrocortisone (ANUSOL-HC) 2.5 % rectal cream Place 1 application rectally daily as needed for hemorrhoids or itching.    [provider]    Family History Family History  Problem Relation  Age of Onset  . Diabetes Daughter   . Cervical cancer Sister   . Colon cancer Neg Hx   . Colon polyps Neg Hx     Social History Social History   Tobacco Use  . Smoking status: Former Research scientist (life sciences)  . Smokeless tobacco: Never Used  Substance Use Topics  . Alcohol use: Yes    Alcohol/week: 2.0 oz    Types: 4 Standard drinks or equivalent per week    Comment: 4 per week  . Drug use: No     Allergies   Patient has no known allergies.   Review of Systems Review of Systems   Physical Exam Triage Vital Signs ED Triage Vitals  Enc Vitals  Group     BP 05/07/17 1527 (!) 155/83     Pulse Rate 05/07/17 1527 72     Resp 05/07/17 1527 16     Temp 05/07/17 1527 99.2 F (37.3 C)     Temp Source 05/07/17 1527 Oral     SpO2 05/07/17 1527 95 %     Weight --      Height --      Head Circumference --      Peak Flow --      Pain Score 05/07/17 1524 2     Pain Loc --      Pain Edu? --      Excl. in [place]? --    No data found.  Updated Vital Signs BP (!) 155/83 (BP Location: Left Arm)   Pulse 72   Temp 99.2 F (37.3 C) (Oral)   Resp 16   SpO2 95%   Visual Acuity Right Eye Distance:   Left Eye Distance:   Bilateral Distance:    Right Eye Near:   Left Eye Near:    Bilateral Near:     Physical Exam  Constitutional: He is oriented to person, place, and time. He appears well-developed and well-nourished.  HENT:  Head: Normocephalic and atraumatic.  Right Ear: Tympanic membrane, external ear and ear canal normal.  Left Ear: Tympanic membrane, external ear and ear canal normal.  Nose: Rhinorrhea present. Right sinus exhibits no maxillary sinus tenderness and no frontal sinus tenderness. Left sinus exhibits no maxillary sinus tenderness and no frontal sinus tenderness.  Mouth/Throat: Uvula is midline, oropharynx is clear and moist and mucous membranes are normal.  Eyes: Conjunctivae are normal. Pupils are equal, round, and reactive to light.  Neck: Normal range of motion.  Cardiovascular: Normal rate and regular rhythm.  Pulmonary/Chest: Effort normal and breath sounds normal.  Strong productive moist cough noted  Lymphadenopathy:    He has no cervical adenopathy.  Neurological: He is alert and oriented to person, place, and time.  Skin: Skin is warm and dry.  Vitals reviewed.    UC Treatments / Results  Labs (all labs ordered are listed, but only abnormal results are displayed) Labs Reviewed - No data to display  EKG  EKG Interpretation None       Radiology No results found.  Procedures Procedures  (including critical care time)  Medications Ordered in UC Medications - No data to display   Initial Impression / Assessment and Plan / UC Course  I have reviewed the triage vital signs and the nursing notes.  Pertinent labs & imaging results that were available during my care of the patient were reviewed by me and considered in my medical decision making (see chart for details).     Daily flonase, complete course of  antibiotics. Push fluids. If symptoms worsen or do not improve in the next week to return to be seen or to follow up with PCP.  Return precautions provided. Patient verbalized understanding and agreeable to plan.    Final Clinical Impressions(s) / UC Diagnoses   Final diagnoses:  Acute pansinusitis, recurrence not specified    ED Discharge Orders        Ordered    doxycycline (VIBRAMYCIN) 100 MG capsule  2 times daily     05/07/17 1633    fluticasone (FLONASE) 50 MCG/ACT nasal spray  Daily     05/07/17 1633       Controlled Substance Prescriptions [place] Controlled Substance Registry consulted? Not Applicable   Zigmund Gottron, NP 05/07/17 [phone]

## 2018-04-24 DIAGNOSIS — I10 Essential (primary) hypertension: Secondary | ICD-10-CM | POA: Diagnosis not present

## 2018-04-24 DIAGNOSIS — E7800 Pure hypercholesterolemia, unspecified: Secondary | ICD-10-CM | POA: Diagnosis not present

## 2018-04-29 DIAGNOSIS — Z7901 Long term (current) use of anticoagulants: Secondary | ICD-10-CM | POA: Diagnosis not present

## 2018-04-29 DIAGNOSIS — I10 Essential (primary) hypertension: Secondary | ICD-10-CM | POA: Diagnosis not present

## 2018-04-29 DIAGNOSIS — Z1212 Encounter for screening for malignant neoplasm of rectum: Secondary | ICD-10-CM | POA: Diagnosis not present

## 2018-04-29 DIAGNOSIS — I482 Chronic atrial fibrillation, unspecified: Secondary | ICD-10-CM | POA: Diagnosis not present

## 2018-04-29 DIAGNOSIS — D61818 Other pancytopenia: Secondary | ICD-10-CM | POA: Diagnosis not present

## 2018-04-29 DIAGNOSIS — D72819 Decreased white blood cell count, unspecified: Secondary | ICD-10-CM | POA: Diagnosis not present

## 2018-04-29 DIAGNOSIS — Z8673 Personal history of transient ischemic attack (TIA), and cerebral infarction without residual deficits: Secondary | ICD-10-CM | POA: Diagnosis not present

## 2018-04-29 DIAGNOSIS — Z0001 Encounter for general adult medical examination with abnormal findings: Secondary | ICD-10-CM | POA: Diagnosis not present

## 2018-04-29 DIAGNOSIS — R3129 Other microscopic hematuria: Secondary | ICD-10-CM | POA: Diagnosis not present

## 2018-04-29 DIAGNOSIS — D696 Thrombocytopenia, unspecified: Secondary | ICD-10-CM | POA: Diagnosis not present

## 2018-04-29 DIAGNOSIS — Z23 Encounter for immunization: Secondary | ICD-10-CM | POA: Diagnosis not present

## 2018-05-22 ENCOUNTER — Emergency Department (HOSPITAL_COMMUNITY)
Admission: EM | Admit: 2018-05-22 | Discharge: 2018-05-22 | Disposition: A | Discharge status: Home / Self Care | Payer: Medicare Other | Attending: Emergency Medicine | Admitting: Emergency Medicine

## 2018-05-22 ENCOUNTER — Encounter (HOSPITAL_COMMUNITY): Payer: Self-pay | Admitting: Emergency Medicine

## 2018-05-22 ENCOUNTER — Other Ambulatory Visit: Payer: Self-pay

## 2018-05-22 ENCOUNTER — Emergency Department (HOSPITAL_COMMUNITY): Payer: Medicare Other

## 2018-05-22 DIAGNOSIS — Z87891 Personal history of nicotine dependence: Secondary | ICD-10-CM | POA: Diagnosis not present

## 2018-05-22 DIAGNOSIS — I10 Essential (primary) hypertension: Secondary | ICD-10-CM | POA: Insufficient documentation

## 2018-05-22 DIAGNOSIS — R072 Precordial pain: Secondary | ICD-10-CM | POA: Diagnosis not present

## 2018-05-22 DIAGNOSIS — Z79899 Other long term (current) drug therapy: Secondary | ICD-10-CM | POA: Insufficient documentation

## 2018-05-22 DIAGNOSIS — R079 Chest pain, unspecified: Secondary | ICD-10-CM | POA: Diagnosis not present

## 2018-05-22 DIAGNOSIS — R0789 Other chest pain: Secondary | ICD-10-CM | POA: Diagnosis present

## 2018-05-22 DIAGNOSIS — I2510 Atherosclerotic heart disease of native coronary artery without angina pectoris: Secondary | ICD-10-CM | POA: Insufficient documentation

## 2018-05-22 LAB — BASIC METABOLIC PANEL
ANION GAP: 7 (ref 5–15)
BUN: 18 mg/dL (ref 8–23)
CALCIUM: 9 mg/dL (ref 8.9–10.3)
CO2: 28 mmol/L (ref 22–32)
Chloride: 107 mmol/L (ref 98–111)
Creatinine, Ser: 1.43 mg/dL — ABNORMAL HIGH (ref 0.61–1.24)
GFR calc Af Amer: 53 mL/min — ABNORMAL LOW (ref >60)
GFR calc non Af Amer: 46 mL/min — ABNORMAL LOW (ref >60)
GLUCOSE: 103 mg/dL — AB (ref 70–99)
Potassium: 3.7 mmol/L (ref 3.5–5.1)
Sodium: 142 mmol/L (ref 135–145)

## 2018-05-22 LAB — CBC
HCT: 34.2 % — ABNORMAL LOW (ref 39.0–52.0)
Hemoglobin: 11.4 g/dL — ABNORMAL LOW (ref 13.0–17.0)
MCH: 29.5 pg (ref 26.0–34.0)
MCHC: 33.3 g/dL (ref 30.0–36.0)
MCV: 88.4 fL (ref 80.0–100.0)
Platelets: 127 10*3/uL — ABNORMAL LOW (ref 150–400)
RBC: 3.87 MIL/uL — ABNORMAL LOW (ref 4.22–5.81)
RDW: 12.9 % (ref 11.5–15.5)
WBC: 3.2 10*3/uL — ABNORMAL LOW (ref 4.0–10.5)
nRBC: 0 % (ref 0.0–0.2)

## 2018-05-22 LAB — I-STAT TROPONIN, ED
Troponin i, poc: 0 ng/mL (ref 0.00–0.08)
Troponin i, poc: 0.02 ng/mL (ref 0.00–0.08)

## 2018-05-22 NOTE — ED Notes (Signed)
Pt alert and oriented in NAD. Pt and grand daughter verbalized understanding of discharge instructions.

## 2018-05-22 NOTE — ED Provider Notes (Signed)
[facility] EMERGENCY DEPARTMENT Provider Note   CSN: [id number] Arrival date & time: 05/22/18  1021     History   Chief Complaint Chief Complaint  Patient presents with  . Chest Pain    HPI Adrian Daniels is a 84 y.o. male.  HPI Patient is an 84 year old male with no known history of coronary artery disease but does have a history of atrial fibrillation presents to the emergency department with complaints of intermittent right-sided chest pain that then radiates into his left chest.  He states these episodes of pain last for approximately 1 minute and then can go away for several hours.  It is sharp in nature.  He has no associated nausea or vomiting or shortness of breath or diaphoresis.  It does not necessarily occur with exertion.  It can occur with rest.  It is been new over the past 3 days.  He denies cough.  Denies congestion.  No fevers or chills.  No unilateral leg swelling.  No history of DVT or pulmonary embolism.   Past Medical History:  Diagnosis Date  . Anemia, unspecified   . Atrial fibrillation ([place])   . Dysrhythmia   . Hemorrhoids   . History of colon polyps 05-28-2007   Adenomatous polyp(Colonoscopy with Dr. Lajoyce Corners)  . Hx of adenomatous colonic polyps 02/24/2003   2004,2006,2009 Dr. Lajoyce Corners  . Hyperlipemia   . Hypertension   . Internal hemorrhoids, bleeding and Grade 3 prolapse 12/09/2014  . Wears dentures     Patient Active Problem List   Diagnosis Date Noted  . Internal hemorrhoids, bleeding and Grade 3 prolapse 12/09/2014  . Atrial fibrillation ([place]) 12/09/2014  . Hx of adenomatous colonic polyps 02/24/2003    Past Surgical History:  Procedure Laterality Date  . CARDIOVERSION N/A 03/02/2015   Procedure: CARDIOVERSION;  Surgeon: Adrian Prows, MD;  Location: [facility];  Service: Cardiovascular;  Laterality: N/A;  . COLONOSCOPY  2009  . HEMORRHOID BANDING  2016  . INGUINAL HERNIA REPAIR Left 08/10/2015   Procedure: LEFT INGUINAL HERNIA  REPAIR WITH MESH;  Surgeon: Donnie Mesa, MD;  Location: [facility];  Service: General;  Laterality: Left;  . INSERTION OF MESH Left 08/10/2015   Procedure: INSERTION OF MESH;  Surgeon: Donnie Mesa, MD;  Location: [facility];  Service: General;  Laterality: Left;  Marland Kitchen MULTIPLE TOOTH EXTRACTIONS          Home Medications    Prior to Admission medications   Medication Sig Start Date End Date Taking? Authorizing Provider  diltiazem (CARDIZEM CD) 300 MG 24 hr capsule Take 300 mg by mouth daily.    [provider]  edoxaban (SAVAYSA) 60 MG TABS tablet Take 60 mg by mouth daily.    [provider]  fluticasone (FLONASE) 50 MCG/ACT nasal spray Place 1 spray into both nostrils daily. 05/07/17   Zigmund Gottron, NP  HYDROcodone-acetaminophen (NORCO/VICODIN) 5-325 MG tablet Take 1 tablet by mouth every 4 (four) hours as needed. 08/10/15   Donnie Mesa, MD  hydrocortisone (ANUSOL-HC) 2.5 % rectal cream Place 1 application rectally daily as needed for hemorrhoids or itching.    [provider]  loratadine (CLARITIN) 10 MG tablet Take 10 mg by mouth daily as needed for allergies.     [provider]  pravastatin (PRAVACHOL) 80 MG tablet Take 80 mg by mouth daily. 04/30/15   [provider]  traMADol (ULTRAM) 50 MG tablet Take 1 tablet (50 mg total) by mouth every 6 (six) hours  as needed. 02/05/14   Ernestina Patches, MD  valsartan-hydrochlorothiazide (DIOVAN-HCT) 320-12.5 MG per tablet Take 1 tablet by mouth daily. 01/11/15   [provider]    Family History Family History  Problem Relation Age of Onset  . Diabetes Daughter   . Cervical cancer Sister   . Colon cancer Neg Hx   . Colon polyps Neg Hx     Social History Social History   Tobacco Use  . Smoking status: Former Research scientist (life sciences)  . Smokeless tobacco: Never Used  Substance Use Topics  . Alcohol use: Yes    Alcohol/week: 4.0 standard drinks    Types: 4 Standard drinks or equivalent per week     Comment: 4 per week  . Drug use: No     Allergies   Patient has no known allergies.   Review of Systems Review of Systems  All other systems reviewed and are negative.    Physical Exam Updated Vital Signs BP (!) 163/99   Pulse 67   Temp 98 F (36.7 C) (Oral)   Resp 13   Ht 5' 11.5" (1.816 m)   Wt 83.5 kg   SpO2 100%   BMI 25.31 kg/m   Physical Exam Vitals signs and nursing note reviewed.  Constitutional:      Appearance: He is well-developed.  HENT:     Head: Normocephalic and atraumatic.  Neck:     Musculoskeletal: Normal range of motion.  Cardiovascular:     Rate and Rhythm: Normal rate and regular rhythm.     Heart sounds: Normal heart sounds.  Pulmonary:     Effort: Pulmonary effort is normal. No respiratory distress.     Breath sounds: Normal breath sounds.  Abdominal:     General: There is no distension.     Palpations: Abdomen is soft.     Tenderness: There is no abdominal tenderness.  Musculoskeletal: Normal range of motion.  Skin:    General: Skin is warm and dry.  Neurological:     Mental Status: He is alert and oriented to person, place, and time.  Psychiatric:        Judgment: Judgment normal.      ED Treatments / Results  Labs (all labs ordered are listed, but only abnormal results are displayed) Labs Reviewed  BASIC METABOLIC PANEL - Abnormal; Notable for the following components:      Result Value   Glucose, Bld 103 (*)    Creatinine, Ser 1.43 (*)    GFR calc non Af Amer 46 (*)    GFR calc Af Amer 53 (*)    All other components within normal limits  CBC - Abnormal; Notable for the following components:   WBC 3.2 (*)    RBC 3.87 (*)    Hemoglobin 11.4 (*)    HCT 34.2 (*)    Platelets 127 (*)    All other components within normal limits  I-STAT TROPONIN, ED  I-STAT TROPONIN, ED    EKG EKG Interpretation  Date/Time:  Wednesday May 22 2018 10:25:15 EST Ventricular Rate:  63 PR Interval:    QRS Duration: 106 QT  Interval:  400 QTC Calculation: 409 R Axis:   67 Text Interpretation:  suspect normal sinus rhythm with artifact Abnormal ECG Confirmed by Jola Schmidt [phone]) on 05/22/2018 11:17:31 AM   Radiology Dg Chest 2 View  Result Date: 05/22/2018 CLINICAL DATA:  Chest pain. EXAM: CHEST - 2 VIEW COMPARISON:  None. FINDINGS: Mild cardiomegaly is noted. No pneumothorax or pleural effusion  is noted. Both lungs are clear. The visualized skeletal structures are unremarkable. IMPRESSION: No active cardiopulmonary disease. Electronically Signed   By: Marijo Conception, M.D.   On: 05/22/2018 10:59    Procedures Procedures (including critical care time)  Medications Ordered in ED Medications - No data to display   Initial Impression / Assessment and Plan / ED Course  I have reviewed the triage vital signs and the nursing notes.  Pertinent labs & imaging results that were available during my care of the patient were reviewed by me and considered in my medical decision making (see chart for details).     EKG without ischemic changes.  Troponin negative x2.  Atypical chest pain.  Low suspicion for ACS.  Doubt PE.  Doubt dissection.  Given patient's advanced age she will need outpatient follow-up with his cardiologist.  His cardiologist is Dr. Einar Gip.  He may benefit from provocative testing as an outpatient.  Patient is encouraged to return the emergency department for new or worsening symptoms  Final Clinical Impressions(s) / ED Diagnoses   Final diagnoses:  Precordial chest pain    ED Discharge Orders    None       Jola Schmidt, MD 05/22/18 2326

## 2018-05-22 NOTE — ED Triage Notes (Signed)
Pt arrives pov from home with c/o chest pain that started 3 days ago and has been coming and going. Pt states it starts in right and moves into left. Pt describes it has sharp.

## 2018-05-31 DIAGNOSIS — I482 Chronic atrial fibrillation, unspecified: Secondary | ICD-10-CM | POA: Diagnosis not present

## 2018-05-31 DIAGNOSIS — R0789 Other chest pain: Secondary | ICD-10-CM | POA: Diagnosis not present

## 2018-05-31 DIAGNOSIS — N183 Chronic kidney disease, stage 3 (moderate): Secondary | ICD-10-CM | POA: Diagnosis not present

## 2018-05-31 DIAGNOSIS — I129 Hypertensive chronic kidney disease with stage 1 through stage 4 chronic kidney disease, or unspecified chronic kidney disease: Secondary | ICD-10-CM | POA: Diagnosis not present

## 2018-06-05 DIAGNOSIS — I4891 Unspecified atrial fibrillation: Secondary | ICD-10-CM | POA: Diagnosis not present

## 2018-06-05 DIAGNOSIS — I10 Essential (primary) hypertension: Secondary | ICD-10-CM | POA: Diagnosis not present

## 2018-06-05 DIAGNOSIS — R0789 Other chest pain: Secondary | ICD-10-CM | POA: Diagnosis not present

## 2018-06-10 DIAGNOSIS — R079 Chest pain, unspecified: Secondary | ICD-10-CM | POA: Diagnosis not present

## 2018-06-10 DIAGNOSIS — E785 Hyperlipidemia, unspecified: Secondary | ICD-10-CM | POA: Diagnosis not present

## 2018-06-10 DIAGNOSIS — I480 Paroxysmal atrial fibrillation: Secondary | ICD-10-CM | POA: Diagnosis not present

## 2018-06-10 DIAGNOSIS — I10 Essential (primary) hypertension: Secondary | ICD-10-CM | POA: Diagnosis not present

## 2018-06-30 ENCOUNTER — Encounter: Payer: Self-pay | Admitting: Cardiology

## 2018-06-30 DIAGNOSIS — N183 Chronic kidney disease, stage 3 unspecified: Secondary | ICD-10-CM

## 2018-06-30 DIAGNOSIS — I10 Essential (primary) hypertension: Secondary | ICD-10-CM | POA: Insufficient documentation

## 2018-06-30 DIAGNOSIS — Z0189 Encounter for other specified special examinations: Secondary | ICD-10-CM | POA: Insufficient documentation

## 2018-06-30 DIAGNOSIS — R0789 Other chest pain: Secondary | ICD-10-CM

## 2018-06-30 HISTORY — DX: Essential (primary) hypertension: I10

## 2018-06-30 HISTORY — DX: Other chest pain: R07.89

## 2018-06-30 HISTORY — DX: Chronic kidney disease, stage 3 unspecified: N18.30

## 2018-06-30 NOTE — Progress Notes (Signed)
Subjective:  Primary Physician:  [name], MD  Patient ID: Adrian Daniels, male    DOB: 10-Aug-1934, 84 y.o.   MRN: [id number]  Chief Complaint  Patient presents with  . Atrial Fibrillation  . Chest Pain  . Follow-up    Test results    HPI: Adrian Daniels  is a 84 y.o. male  with  history of chronic atrial fibrillation, hypertension, hyperlipidemia, who I had last seen in May of 2018, presented to the emergency room on 05/22/2018 with chest pain. He was ruled out for myocardial infarction, EKG did not reveal any acute abnormality, CXR normal, he was discharged home with recommendation to follow-up in the outpatient basis. Underwent echo and nuclear stress and presents for f/u. Also his BP was high and I had added Hydralazine.  Patient is on Eliquis and tolerating this without bleeding complications.  Due to chest pain, underwent cardiac testing and presents for follow-up.  States that he has not had any recurrence of chest pain.  He is also tolerating hydralazine without any side effects.  Overall today he has no specific complaints.  Past Medical History:  Diagnosis Date  . Anemia, unspecified   . Atrial fibrillation ([place])   . Atypical chest pain 06/30/2018  . CKD (chronic kidney disease) stage 3, GFR 30-59 ml/min ([place]) 06/30/2018  . Dysrhythmia   . Essential hypertension 06/30/2018  . H/O TIA (transient ischemic attack) and stroke 06/30/2005  . Hemorrhoids   . History of colon polyps 05-28-2007   Adenomatous polyp(Colonoscopy with Dr. Lajoyce Corners)  . Hx of adenomatous colonic polyps 02/24/2003   2004,2006,2009 Dr. Lajoyce Corners  . Hyperlipemia   . Hypertension   . Internal hemorrhoids, bleeding and Grade 3 prolapse 12/09/2014  . Wears dentures     Past Surgical History:  Procedure Laterality Date  . CARDIOVERSION N/A 03/02/2015   Procedure: CARDIOVERSION;  Surgeon: Adrian Prows, MD;  Location: [place];  Service: Cardiovascular;  Laterality: N/A;  . COLONOSCOPY  2009  . HEMORRHOID  BANDING  2016  . INGUINAL HERNIA REPAIR Left 08/10/2015   Procedure: LEFT INGUINAL HERNIA REPAIR WITH MESH;  Surgeon: Donnie Mesa, MD;  Location: [place];  Service: General;  Laterality: Left;  . INSERTION OF MESH Left 08/10/2015   Procedure: INSERTION OF MESH;  Surgeon: Donnie Mesa, MD;  Location: [place];  Service: General;  Laterality: Left;  Marland Kitchen MULTIPLE TOOTH EXTRACTIONS      Social History   Socioeconomic History  . Marital status: Single    Spouse name: Not on file  . Number of children: Not on file  . Years of education: Not on file  . Highest education level: Not on file  Occupational History  . Not on file  Social Needs  . Financial resource strain: Not on file  . Food insecurity:    Worry: Not on file    Inability: Not on file  . Transportation needs:    Medical: Not on file    Non-medical: Not on file  Tobacco Use  . Smoking status: Former Research scientist (life sciences)  . Smokeless tobacco: Never Used  Substance and Sexual Activity  . Alcohol use: Yes    Alcohol/week: 4.0 standard drinks    Types: 4 Standard drinks or equivalent per week    Comment: 4 per week  . Drug use: No  . Sexual activity: Not on file  Lifestyle  . Physical activity:    Days per week: Not on file    Minutes per session: Not  on file  . Stress: Not on file  Relationships  . Social connections:    Talks on phone: Not on file    Gets together: Not on file    Attends religious service: Not on file    Active member of club or organization: Not on file    Attends meetings of clubs or organizations: Not on file    Relationship status: Not on file  . Intimate partner violence:    Fear of current or ex partner: Not on file    Emotionally abused: Not on file    Physically abused: Not on file    Forced sexual activity: Not on file  Other Topics Concern  . Not on file  Social History Narrative  . Not on file    Current Outpatient Medications on File Prior to Visit  Medication Sig Dispense Refill  . diltiazem  (CARDIZEM CD) 300 MG 24 hr capsule Take 300 mg by mouth daily.    Marland Kitchen edoxaban (SAVAYSA) 60 MG TABS tablet Take 60 mg by mouth daily.    . fluticasone (FLONASE) 50 MCG/ACT nasal spray Place 1 spray into both nostrils daily. 16 g 2  . HYDROcodone-acetaminophen (NORCO/VICODIN) 5-325 MG tablet Take 1 tablet by mouth every 4 (four) hours as needed. 30 tablet 0  . hydrocortisone (ANUSOL-HC) 2.5 % rectal cream Place 1 application rectally daily as needed for hemorrhoids or itching.    . loratadine (CLARITIN) 10 MG tablet Take 10 mg by mouth daily as needed for allergies.     . pravastatin (PRAVACHOL) 80 MG tablet Take 80 mg by mouth daily.  3  . traMADol (ULTRAM) 50 MG tablet Take 1 tablet (50 mg total) by mouth every 6 (six) hours as needed. 20 tablet 0  . valsartan-hydrochlorothiazide (DIOVAN-HCT) 320-12.5 MG per tablet Take 1 tablet by mouth daily.  4   No current facility-administered medications on file prior to visit.    Review of Systems  Constitutional: Negative for malaise/fatigue and weight loss.  Respiratory: Negative for cough, hemoptysis and shortness of breath.   Cardiovascular: Negative for chest pain, palpitations, claudication and leg swelling.  Gastrointestinal: Negative for abdominal pain, blood in stool, constipation, heartburn and vomiting.  Genitourinary: Negative for dysuria.  Musculoskeletal: Positive for back pain (chronic). Negative for joint pain and myalgias.  Neurological: Negative for dizziness, focal weakness and headaches.  Endo/Heme/Allergies: Does not bruise/bleed easily.  Psychiatric/Behavioral: Negative for depression. The patient is not nervous/anxious.   All other systems reviewed and are negative.      Objective:  Blood pressure 132/76, pulse 73, height _0  (1.803 m), weight 185 lb (83.9 kg), SpO2 98 %. Body mass index is 25.8 kg/m.  Physical Exam  Constitutional: He appears well-developed and well-nourished. No distress.  HENT:  Head: Atraumatic.    Eyes: Conjunctivae are normal.  Neck: Neck supple. No JVD present. No thyromegaly present.  Cardiovascular: Normal rate and intact distal pulses. An irregularly irregular rhythm present. Exam reveals no gallop.  No murmur heard. Variable S1 and normal S2. There is 2+ pitting edema bilateral legs.   Pulmonary/Chest: Effort normal and breath sounds normal.  Abdominal: Soft. Bowel sounds are normal.  Musculoskeletal: Normal range of motion.        General: No edema.  Neurological: He is alert.  Skin: Skin is warm and dry.  Psychiatric: He has a normal mood and affect.   CARDIAC STUDIES:  Exercise myoview stress 06/05/2018:  1. The patient performed treadmill exercise using Bruce protocol,  completing 5:07 minutes. The patient completed an estimated workload of 7 METS, reaching 92% of the maximum predicted heart rate. Exercise capacity was low.. Resting hypertension 142/106 mmHg, with exaggerated exercise response, peak BP reaching 220/80 mmHg. No stress symptoms reported. No ischemic changes seen on stress electrocardiogram.  2. The overall quality of the study is good. There is no evidence of abnormal lung activity. Stress and rest SPECT images demonstrate homogeneous tracer distribution throughout the myocardium. Gated SPECT imaging reveals normal myocardial thickening and wall motion. The left ventricular ejection fraction was calculated 48%, although visually appears normal.  3. Low risk study.  Echocardiogram 06/10/2018: Left ventricle cavity is normal in size. Mild concentric hypertrophy of the left ventricle. Normal global wall motion. Unable to evaluate diastolic function due to A. Fibrillation. Calculated EF 59%. Left atrial cavity is severely dilated. Right atrial cavity is moderately dilated. Mild (Grade I) aortic regurgitation. Moderate (Grade II) mitral regurgitation. Moderate tricuspid regurgitation. Estimated pulmonary artery systolic pressure 27  mmHg. The aortic root is  dilated, ascending aorta measuring 4.3 cm. Compared to previous study in 2016, biatrial enlargement and valvular regurgitation increased in severity.  Lower extremity arterial duplex 01/27/2015: No hemodynamically significant stenoses are identified in the bilateral lower extremity arterial system.This exam reveals normal perfusion of both the lower extremities with RABI 1.03 and LABI 0.96.  Assessment & Recommendations:   1. Permanent atrial fibrillation CHA2DS2-VASc Score is 6.  -(CHF; HTN; vasc disease DM,  Male = 1; Age <65 =0; 65-74 = 1,  >75 =2; stroke = 2).  -(Yearly risk of stroke: Score of 1=1.3; 2=2.2; 3=3.2; 4=4; 5=6.7; 6=9.8; 7=>9.8)   2. H/O TIA (transient ischemic attack) and stroke In 2007 with left sided weakness resolved on presenting to ED (historical)  3. Atypical chest pain I reviewed the results of the stress test, low risk stress test.  He is also not had recurrence of chest pain.  Hence continue present medical therapy.  4. Essential hypertension Blood pressure is now well controlled, tolerating hydralazine.  No worsening leg edema.  Hence continue the same.  5. CKD (chronic kidney disease) stage 3, GFR 30-59 ml/min ([facility]) Renal function remained stable.  6. Laboratory examination Labs 05/22/2018: Serum glucose 13 mg, potassium 3.7, BUN 18, creatinine 1.43, eGFR 53 mL.  HB 11.4/HCT 34.2, platelets 05/25/25.  Normal indicis.  10/18/2016: Creatinine 1.5, EGFR 50, potassium 3.9, BMP otherwise normal.  Hemoglobin 12.5, hematocrit 35.0, CBC otherwise normal.   Recommendation: patient is overall doing well, has not had recurrence of chest pain, I have reviewed the results of the echocardiogram and also the stress test with the patient.  He is tolerating anti-correlation without bleeding diathesis.  No changes in the medications were done today.  I'll see him back in 6 months or sooner if problems.  Adrian Prows, MD, [facility] 07/01/2018, 2:20 PM [facility].  [place] Pager: [phone] Office: [phone] If no answer Cell [phone]

## 2018-07-01 ENCOUNTER — Ambulatory Visit (INDEPENDENT_AMBULATORY_CARE_PROVIDER_SITE_OTHER): Payer: Medicare Other | Admitting: Cardiology

## 2018-07-01 ENCOUNTER — Encounter: Payer: Self-pay | Admitting: Cardiology

## 2018-07-01 VITALS — BP 132/76 | HR 73 | Ht 71.0 in | Wt 185.0 lb

## 2018-07-01 DIAGNOSIS — I10 Essential (primary) hypertension: Secondary | ICD-10-CM

## 2018-07-01 DIAGNOSIS — Z8673 Personal history of transient ischemic attack (TIA), and cerebral infarction without residual deficits: Secondary | ICD-10-CM

## 2018-07-01 DIAGNOSIS — N183 Chronic kidney disease, stage 3 unspecified: Secondary | ICD-10-CM

## 2018-07-01 DIAGNOSIS — Z0189 Encounter for other specified special examinations: Secondary | ICD-10-CM | POA: Diagnosis not present

## 2018-07-01 DIAGNOSIS — R0789 Other chest pain: Secondary | ICD-10-CM

## 2018-07-01 DIAGNOSIS — I4821 Permanent atrial fibrillation: Secondary | ICD-10-CM

## 2018-07-01 MED ORDER — HYDRALAZINE HCL 50 MG PO TABS: 50.0000 mg | ORAL_TABLET | Freq: Three times a day (TID) | ORAL | 3 refills | Status: DC

## 2018-07-15 DIAGNOSIS — R351 Nocturia: Secondary | ICD-10-CM | POA: Diagnosis not present

## 2018-07-15 DIAGNOSIS — N401 Enlarged prostate with lower urinary tract symptoms: Secondary | ICD-10-CM | POA: Diagnosis not present

## 2018-07-15 DIAGNOSIS — N3943 Post-void dribbling: Secondary | ICD-10-CM | POA: Diagnosis not present

## 2018-07-15 DIAGNOSIS — R3915 Urgency of urination: Secondary | ICD-10-CM | POA: Diagnosis not present

## 2018-07-20 ENCOUNTER — Emergency Department (HOSPITAL_COMMUNITY): Payer: Medicare Other

## 2018-07-20 ENCOUNTER — Encounter (HOSPITAL_COMMUNITY): Payer: Self-pay

## 2018-07-20 ENCOUNTER — Observation Stay (HOSPITAL_COMMUNITY)
Admission: EM | Admit: 2018-07-20 | Discharge: 2018-07-21 | Disposition: A | Discharge status: Home / Self Care | Payer: Medicare Other | Attending: Internal Medicine | Admitting: Internal Medicine

## 2018-07-20 DIAGNOSIS — R55 Syncope and collapse: Secondary | ICD-10-CM | POA: Diagnosis not present

## 2018-07-20 DIAGNOSIS — I4821 Permanent atrial fibrillation: Secondary | ICD-10-CM | POA: Diagnosis not present

## 2018-07-20 DIAGNOSIS — J9811 Atelectasis: Secondary | ICD-10-CM | POA: Diagnosis not present

## 2018-07-20 DIAGNOSIS — E785 Hyperlipidemia, unspecified: Secondary | ICD-10-CM | POA: Insufficient documentation

## 2018-07-20 DIAGNOSIS — R52 Pain, unspecified: Secondary | ICD-10-CM | POA: Diagnosis not present

## 2018-07-20 DIAGNOSIS — N183 Chronic kidney disease, stage 3 unspecified: Secondary | ICD-10-CM | POA: Diagnosis present

## 2018-07-20 DIAGNOSIS — R61 Generalized hyperhidrosis: Secondary | ICD-10-CM | POA: Diagnosis not present

## 2018-07-20 DIAGNOSIS — R2681 Unsteadiness on feet: Secondary | ICD-10-CM | POA: Diagnosis not present

## 2018-07-20 DIAGNOSIS — Z8673 Personal history of transient ischemic attack (TIA), and cerebral infarction without residual deficits: Secondary | ICD-10-CM | POA: Insufficient documentation

## 2018-07-20 DIAGNOSIS — I10 Essential (primary) hypertension: Secondary | ICD-10-CM

## 2018-07-20 DIAGNOSIS — E876 Hypokalemia: Secondary | ICD-10-CM | POA: Diagnosis not present

## 2018-07-20 DIAGNOSIS — Z79899 Other long term (current) drug therapy: Secondary | ICD-10-CM | POA: Insufficient documentation

## 2018-07-20 DIAGNOSIS — Z87891 Personal history of nicotine dependence: Secondary | ICD-10-CM | POA: Insufficient documentation

## 2018-07-20 DIAGNOSIS — R001 Bradycardia, unspecified: Secondary | ICD-10-CM | POA: Diagnosis not present

## 2018-07-20 DIAGNOSIS — R531 Weakness: Secondary | ICD-10-CM | POA: Diagnosis not present

## 2018-07-20 DIAGNOSIS — I129 Hypertensive chronic kidney disease with stage 1 through stage 4 chronic kidney disease, or unspecified chronic kidney disease: Secondary | ICD-10-CM | POA: Insufficient documentation

## 2018-07-20 DIAGNOSIS — I4891 Unspecified atrial fibrillation: Secondary | ICD-10-CM | POA: Diagnosis not present

## 2018-07-20 DIAGNOSIS — I959 Hypotension, unspecified: Secondary | ICD-10-CM | POA: Diagnosis not present

## 2018-07-20 DIAGNOSIS — Z833 Family history of diabetes mellitus: Secondary | ICD-10-CM | POA: Insufficient documentation

## 2018-07-20 DIAGNOSIS — Z7901 Long term (current) use of anticoagulants: Secondary | ICD-10-CM | POA: Insufficient documentation

## 2018-07-20 LAB — URINALYSIS, ROUTINE W REFLEX MICROSCOPIC
Bilirubin Urine: NEGATIVE
GLUCOSE, UA: NEGATIVE mg/dL
Hgb urine dipstick: NEGATIVE
Ketones, ur: NEGATIVE mg/dL
Leukocytes,Ua: NEGATIVE
Nitrite: NEGATIVE
Protein, ur: NEGATIVE mg/dL
Specific Gravity, Urine: 1.013 (ref 1.005–1.030)
pH: 6 (ref 5.0–8.0)

## 2018-07-20 LAB — CBG MONITORING, ED: Glucose-Capillary: 110 mg/dL — ABNORMAL HIGH (ref 70–99)

## 2018-07-20 LAB — BASIC METABOLIC PANEL
Anion gap: 11 (ref 5–15)
BUN: 25 mg/dL — ABNORMAL HIGH (ref 8–23)
CO2: 24 mmol/L (ref 22–32)
Calcium: 8.5 mg/dL — ABNORMAL LOW (ref 8.9–10.3)
Chloride: 104 mmol/L (ref 98–111)
Creatinine, Ser: 1.92 mg/dL — ABNORMAL HIGH (ref 0.61–1.24)
GFR calc Af Amer: 37 mL/min — ABNORMAL LOW (ref >60)
GFR calc non Af Amer: 32 mL/min — ABNORMAL LOW (ref >60)
Glucose, Bld: 126 mg/dL — ABNORMAL HIGH (ref 70–99)
Potassium: 3.2 mmol/L — ABNORMAL LOW (ref 3.5–5.1)
Sodium: 139 mmol/L (ref 135–145)

## 2018-07-20 LAB — TROPONIN I: Troponin I: <0.03 ng/mL (ref <0.03)

## 2018-07-20 LAB — CBC
HCT: 32.7 % — ABNORMAL LOW (ref 39.0–52.0)
Hemoglobin: 10.8 g/dL — ABNORMAL LOW (ref 13.0–17.0)
MCH: 29.1 pg (ref 26.0–34.0)
MCHC: 33 g/dL (ref 30.0–36.0)
MCV: 88.1 fL (ref 80.0–100.0)
Platelets: 122 10*3/uL — ABNORMAL LOW (ref 150–400)
RBC: 3.71 MIL/uL — AB (ref 4.22–5.81)
RDW: 13.2 % (ref 11.5–15.5)
WBC: 4 10*3/uL (ref 4.0–10.5)
nRBC: 0 % (ref 0.0–0.2)

## 2018-07-20 LAB — MAGNESIUM: Magnesium: 2 mg/dL (ref 1.7–2.4)

## 2018-07-20 LAB — BRAIN NATRIURETIC PEPTIDE: B Natriuretic Peptide: 72.3 pg/mL (ref 0.0–100.0)

## 2018-07-20 MED ORDER — POTASSIUM CHLORIDE CRYS ER 20 MEQ PO TBCR
40.0000 meq | EXTENDED_RELEASE_TABLET | Freq: Once | ORAL | Status: AC
  Administered 2018-07-20: 40 meq via ORAL
  Filled 2018-07-20: qty 2

## 2018-07-20 NOTE — ED Provider Notes (Signed)
[place] EMERGENCY DEPARTMENT Provider Note   CSN: [id number] Arrival date & time: 07/20/18  2010    History   Chief Complaint Chief Complaint  Patient presents with  . Weakness    HPI Adrian Daniels is a 84 y.o. male.     The history is provided by the patient.  Near Syncope  This is a new problem. The current episode started 1 to 2 hours ago. The problem occurs rarely. The problem has been resolved. Pertinent negatives include no chest pain, no abdominal pain, no headaches and no shortness of breath. Nothing relieves the symptoms. He has tried nothing for the symptoms. The treatment provided no relief.    Past Medical History:  Diagnosis Date  . Anemia, unspecified   . Atrial fibrillation ([place])   . Atypical chest pain 06/30/2018  . CKD (chronic kidney disease) stage 3, GFR 30-59 ml/min ([place]) 06/30/2018  . Dysrhythmia   . Essential hypertension 06/30/2018  . H/O TIA (transient ischemic attack) and stroke 06/30/2005  . Hemorrhoids   . History of colon polyps 05-28-2007   Adenomatous polyp(Colonoscopy with Dr. Lajoyce Corners)  . Hx of adenomatous colonic polyps 02/24/2003   2004,2006,2009 Dr. Lajoyce Corners  . Hyperlipemia   . Hypertension   . Internal hemorrhoids, bleeding and Grade 3 prolapse 12/09/2014  . Wears dentures     Patient Active Problem List   Diagnosis Date Noted  . Hypokalemia 07/21/2018  . Near syncope 07/20/2018  . Atypical chest pain 06/30/2018  . Essential hypertension 06/30/2018  . CKD (chronic kidney disease) stage 3, GFR 30-59 ml/min ([facility]) 06/30/2018  . Laboratory examination 06/30/2018  . Atrial fibrillation ([place]) 12/09/2014  . H/O TIA (transient ischemic attack) and stroke 06/30/2005  . Hx of adenomatous colonic polyps 02/24/2003    Past Surgical History:  Procedure Laterality Date  . CARDIOVERSION N/A 03/02/2015   Procedure: CARDIOVERSION;  Surgeon: Adrian Prows, MD;  Location: [place];  Service: Cardiovascular;  Laterality: N/A;  .  COLONOSCOPY  2009  . HEMORRHOID BANDING  2016  . INGUINAL HERNIA REPAIR Left 08/10/2015   Procedure: LEFT INGUINAL HERNIA REPAIR WITH MESH;  Surgeon: Donnie Mesa, MD;  Location: [place];  Service: General;  Laterality: Left;  . INSERTION OF MESH Left 08/10/2015   Procedure: INSERTION OF MESH;  Surgeon: Donnie Mesa, MD;  Location: [place];  Service: General;  Laterality: Left;  Marland Kitchen MULTIPLE TOOTH EXTRACTIONS          Home Medications    Prior to Admission medications   Medication Sig Start Date End Date Taking? Authorizing Provider  diltiazem (CARDIZEM CD) 300 MG 24 hr capsule Take 300 mg by mouth daily.    [provider]  edoxaban (SAVAYSA) 60 MG TABS tablet Take 60 mg by mouth daily.    [provider]  fluticasone (FLONASE) 50 MCG/ACT nasal spray Place 1 spray into both nostrils daily. 05/07/17   Zigmund Gottron, NP  hydrALAZINE (APRESOLINE) 50 MG tablet Take 1 tablet (50 mg total) by mouth 3 (three) times daily. 07/01/18   Adrian Prows, MD  HYDROcodone-acetaminophen (NORCO/VICODIN) 5-325 MG tablet Take 1 tablet by mouth every 4 (four) hours as needed. 08/10/15   Donnie Mesa, MD  hydrocortisone (ANUSOL-HC) 2.5 % rectal cream Place 1 application rectally daily as needed for hemorrhoids or itching.    [provider]  loratadine (CLARITIN) 10 MG tablet Take 10 mg by mouth daily as needed for allergies.     [provider]  pravastatin (  PRAVACHOL) 80 MG tablet Take 80 mg by mouth daily. 04/30/15   [provider]  traMADol (ULTRAM) 50 MG tablet Take 1 tablet (50 mg total) by mouth every 6 (six) hours as needed. 02/05/14   Ernestina Patches, MD  valsartan-hydrochlorothiazide (DIOVAN-HCT) 320-12.5 MG per tablet Take 1 tablet by mouth daily. 01/11/15   [provider]    Family History Family History  Problem Relation Age of Onset  . Diabetes Daughter   . Cervical cancer Sister   . Colon cancer Neg Hx   . Colon polyps Neg Hx     Social  History Social History   Tobacco Use  . Smoking status: Former Research scientist (life sciences)  . Smokeless tobacco: Never Used  Substance Use Topics  . Alcohol use: Yes    Alcohol/week: 4.0 standard drinks    Types: 4 Standard drinks or equivalent per week    Comment: 4 per week  . Drug use: No     Allergies   Patient has no known allergies.   Review of Systems Review of Systems  Constitutional: Negative for chills and fever.  HENT: Negative for ear pain and sore throat.   Eyes: Negative for pain and visual disturbance.  Respiratory: Negative for cough and shortness of breath.   Cardiovascular: Positive for near-syncope. Negative for chest pain and palpitations.  Gastrointestinal: Negative for abdominal pain and vomiting.  Genitourinary: Negative for dysuria and hematuria.  Musculoskeletal: Negative for arthralgias and back pain.  Skin: Negative for color change and rash.  Neurological: Positive for syncope. Negative for seizures and headaches.  All other systems reviewed and are negative.    Physical Exam Updated Vital Signs BP (!) 160/86 (BP Location: Left Arm)   Pulse 68   Temp (!) 97.3 F (36.3 C) (Oral)   Resp 19   SpO2 100%   Physical Exam Vitals signs and nursing note reviewed.  Constitutional:      Appearance: He is well-developed.     Comments: Patient resting comfortably, no acute distress.  HENT:     Head: Normocephalic and atraumatic.  Eyes:     Extraocular Movements: Extraocular movements intact.     Conjunctiva/sclera: Conjunctivae normal.     Pupils: Pupils are equal, round, and reactive to light.  Neck:     Musculoskeletal: Neck supple.  Cardiovascular:     Rate and Rhythm: Regular rhythm. Bradycardia present.     Heart sounds: No murmur.  Pulmonary:     Effort: Pulmonary effort is normal. No respiratory distress.     Breath sounds: Normal breath sounds.  Abdominal:     Palpations: Abdomen is soft.     Tenderness: There is no abdominal tenderness.    Musculoskeletal: Normal range of motion.  Skin:    General: Skin is warm and dry.     Capillary Refill: Capillary refill takes less than 2 seconds.  Neurological:     General: No focal deficit present.     Mental Status: He is alert and oriented to person, place, and time. Mental status is at baseline.     Cranial Nerves: No cranial nerve deficit.     Sensory: No sensory deficit.     Motor: No weakness.     Coordination: Coordination normal.     Gait: Gait normal.     Deep Tendon Reflexes: Reflexes normal.  Psychiatric:        Mood and Affect: Mood normal.      ED Treatments / Results  Labs (all labs ordered are  listed, but only abnormal results are displayed) Labs Reviewed  BASIC METABOLIC PANEL - Abnormal; Notable for the following components:      Result Value   Potassium 3.2 (*)    Glucose, Bld 126 (*)    BUN 25 (*)    Creatinine, Ser 1.92 (*)    Calcium 8.5 (*)    GFR calc non Af Amer 32 (*)    GFR calc Af Amer 37 (*)    All other components within normal limits  CBC - Abnormal; Notable for the following components:   RBC 3.71 (*)    Hemoglobin 10.8 (*)    HCT 32.7 (*)    Platelets 122 (*)    All other components within normal limits  CBG MONITORING, ED - Abnormal; Notable for the following components:   Glucose-Capillary 110 (*)    All other components within normal limits  URINALYSIS, ROUTINE W REFLEX MICROSCOPIC  BRAIN NATRIURETIC PEPTIDE  TROPONIN I  MAGNESIUM    EKG EKG Interpretation  Date/Time:  Saturday July 20 2018 20:12:56 EST Ventricular Rate:  53 PR Interval:    QRS Duration: 109 QT Interval:  440 QTC Calculation: 414 R Axis:   53 Text Interpretation:  Atrial fibrillation Confirmed by Elnora Morrison [phone]) on 07/20/2018 10:16:28 PM   Radiology Dg Chest Portable 1 View  Result Date: 07/20/2018 CLINICAL DATA:  Syncope EXAM: PORTABLE CHEST 1 VIEW COMPARISON:  05/22/2018 chest radiograph. FINDINGS: Stable cardiomediastinal silhouette with  mild cardiomegaly. No pneumothorax. No pleural effusion. No pulmonary edema. Stable mild eventrations of the hemidiaphragms bilaterally. Mild left basilar atelectasis. IMPRESSION: Mild left basilar atelectasis. Mild cardiomegaly without pulmonary edema. Electronically Signed   By: Ilona Sorrel M.D.   On: 07/20/2018 20:51    Procedures Procedures (including critical care time)  Medications Ordered in ED Medications  potassium chloride SA (K-DUR,KLOR-CON) CR tablet 40 mEq (40 mEq Oral Given 07/20/18 2304)     Initial Impression / Assessment and Plan / ED Course  I have reviewed the triage vital signs and the nursing notes.  Pertinent labs & imaging results that were available during my care of the patient were reviewed by me and considered in my medical decision making (see chart for details).        84 year old male patient with significant past medical history listed above who presents with near syncopal-like episode after shopping today.  Patient indicates that it was all sudden and he felt like he was going to pass out patient had to grab onto his car not to fall over, symptoms resolved after a few minutes.  Patient denies any chest pain, neurological symptoms, infectious-like symptoms.  Concern for arrhythmia, valvular problem, neurological pathology.  Will obtain EKG, troponin, electrolytes, chest x-ray.  EKG shows no signs of ST elevation or depression, stable T waves from prior, appropriate intervals, bradycardic.  While in the room patient has bradycardia into the 40s.  Possibly leading to hypoperfusion and syncopal-like syndrome.  Troponin negative, other laboratory studies reviewed, potassium replaced.  Physical exam negative for any neurological deficits at this time.  No indication for CT imaging.  Discussed with inpatient team regarding admission for syncopal work-up.  They are in agreement with this plan.  Patient admitted in stable condition with stable vital signs.  The  above care was discussed and agreed upon by my attending physician.  Final Clinical Impressions(s) / ED Diagnoses   Final diagnoses:  Near syncope    ED Discharge Orders    None  Orson Aloe, MD 07/21/18 [id number]    Elnora Morrison, MD 07/22/18 [phone]

## 2018-07-20 NOTE — ED Triage Notes (Signed)
Pt comes via [facility] for sudden generalized weakness and nausea while grocery shopping. Pt was cool and clammy, hx of afib, with a controlled rate, on eliquis

## 2018-07-21 ENCOUNTER — Encounter (HOSPITAL_COMMUNITY): Payer: Self-pay | Admitting: Family Medicine

## 2018-07-21 ENCOUNTER — Observation Stay (HOSPITAL_BASED_OUTPATIENT_CLINIC_OR_DEPARTMENT_OTHER): Payer: Medicare Other

## 2018-07-21 DIAGNOSIS — I4821 Permanent atrial fibrillation: Secondary | ICD-10-CM | POA: Diagnosis not present

## 2018-07-21 DIAGNOSIS — E876 Hypokalemia: Secondary | ICD-10-CM | POA: Diagnosis present

## 2018-07-21 DIAGNOSIS — N183 Chronic kidney disease, stage 3 (moderate): Secondary | ICD-10-CM | POA: Diagnosis not present

## 2018-07-21 DIAGNOSIS — I361 Nonrheumatic tricuspid (valve) insufficiency: Secondary | ICD-10-CM | POA: Diagnosis not present

## 2018-07-21 DIAGNOSIS — I340 Nonrheumatic mitral (valve) insufficiency: Secondary | ICD-10-CM

## 2018-07-21 DIAGNOSIS — Z8673 Personal history of transient ischemic attack (TIA), and cerebral infarction without residual deficits: Secondary | ICD-10-CM | POA: Diagnosis not present

## 2018-07-21 DIAGNOSIS — I10 Essential (primary) hypertension: Secondary | ICD-10-CM | POA: Diagnosis not present

## 2018-07-21 DIAGNOSIS — R55 Syncope and collapse: Secondary | ICD-10-CM | POA: Diagnosis not present

## 2018-07-21 LAB — ECHOCARDIOGRAM COMPLETE
Height: 74 in
Weight: 2883.2 oz

## 2018-07-21 LAB — CBC
HCT: 29.8 % — ABNORMAL LOW (ref 39.0–52.0)
Hemoglobin: 10.1 g/dL — ABNORMAL LOW (ref 13.0–17.0)
MCH: 29.3 pg (ref 26.0–34.0)
MCHC: 33.9 g/dL (ref 30.0–36.0)
MCV: 86.4 fL (ref 80.0–100.0)
Platelets: 124 10*3/uL — ABNORMAL LOW (ref 150–400)
RBC: 3.45 MIL/uL — ABNORMAL LOW (ref 4.22–5.81)
RDW: 13 % (ref 11.5–15.5)
WBC: 4.7 10*3/uL (ref 4.0–10.5)
nRBC: 0 % (ref 0.0–0.2)

## 2018-07-21 LAB — BASIC METABOLIC PANEL
Anion gap: 7 (ref 5–15)
BUN: 21 mg/dL (ref 8–23)
CO2: 26 mmol/L (ref 22–32)
Calcium: 8.4 mg/dL — ABNORMAL LOW (ref 8.9–10.3)
Chloride: 107 mmol/L (ref 98–111)
Creatinine, Ser: 1.57 mg/dL — ABNORMAL HIGH (ref 0.61–1.24)
GFR calc Af Amer: 47 mL/min — ABNORMAL LOW (ref >60)
GFR calc non Af Amer: 40 mL/min — ABNORMAL LOW (ref >60)
Glucose, Bld: 77 mg/dL (ref 70–99)
POTASSIUM: 3.5 mmol/L (ref 3.5–5.1)
Sodium: 140 mmol/L (ref 135–145)

## 2018-07-21 LAB — CREATININE, URINE, RANDOM: Creatinine, Urine: 115.96 mg/dL

## 2018-07-21 LAB — SODIUM, URINE, RANDOM: Sodium, Ur: 53 mmol/L

## 2018-07-21 LAB — GLUCOSE, CAPILLARY: Glucose-Capillary: 74 mg/dL (ref 70–99)

## 2018-07-21 MED ORDER — HYDROCODONE-ACETAMINOPHEN 5-325 MG PO TABS: 1.0000 | ORAL_TABLET | ORAL | Status: DC | PRN

## 2018-07-21 MED ORDER — SODIUM CHLORIDE 0.9% FLUSH
3.0000 mL | Freq: Two times a day (BID) | INTRAVENOUS | Status: DC
  Administered 2018-07-21 (×2): 3 mL via INTRAVENOUS

## 2018-07-21 MED ORDER — PRAVASTATIN SODIUM 40 MG PO TABS: 80.0000 mg | ORAL_TABLET | Freq: Every day | ORAL | Status: DC

## 2018-07-21 MED ORDER — POLYETHYLENE GLYCOL 3350 17 G PO PACK: 17.0000 g | PACK | Freq: Every day | ORAL | Status: DC | PRN

## 2018-07-21 MED ORDER — ACETAMINOPHEN 325 MG PO TABS: 650.0000 mg | ORAL_TABLET | Freq: Four times a day (QID) | ORAL | Status: DC | PRN

## 2018-07-21 MED ORDER — EDOXABAN TOSYLATE 30 MG PO TABS
30.0000 mg | ORAL_TABLET | Freq: Every day | ORAL | Status: DC
  Administered 2018-07-21: 30 mg via ORAL
  Filled 2018-07-21: qty 1

## 2018-07-21 MED ORDER — POTASSIUM CHLORIDE IN NACL 20-0.9 MEQ/L-% IV SOLN
INTRAVENOUS | Status: AC
  Administered 2018-07-21: 01:00:00 via INTRAVENOUS
  Filled 2018-07-21: qty 1000

## 2018-07-21 MED ORDER — ACETAMINOPHEN 650 MG RE SUPP: 650.0000 mg | Freq: Four times a day (QID) | RECTAL | Status: DC | PRN

## 2018-07-21 NOTE — H&P (Signed)
History and Physical    Adrian Daniels FHL:[id number] DOB: [birth year] DOA: 07/20/2018  PCP: [name], MD   Patient coming from: Home   Chief Complaint: Near syncope   HPI: Adrian Daniels is a 84 y.o. male with medical history significant for atrial fibrillation on edoxaba, chronic kidney disease stage III, hypertension, and history of TIA, now presenting to the emergency department with near syncope and nausea.  Patient reports that he was recently started on 2 new medications, but is unable to recall the names of these, with therefore, or who prescribed them.  He had continued to be in his usual state until earlier on the day of presentation when he was out shopping and became acutely lightheaded, feeling as though he was about to pass out.  He was assisted to a sitting position by a bystander and began to feel better.  He denies any associated chest pain or palpitations, reports some associated nausea without vomiting, and denies any headache, change in vision or hearing, or focal numbness or weakness.  He denies any recent shortness of breath.  Reports that he went on to have similar episodes throughout the evening, both with standing and while seated.  There was never any chest pain or palpitations associated with this.  ED Course: Upon arrival to the ED, patient is found to be afebrile, saturating well on room air, and with vitals otherwise stable.  EKG features atrial fibrillation and chest x-ray is notable for mild left basilar atelectasis and mild cardiomegaly without edema.  Chemistry panel is notable for potassium of 3.2 and creatinine 1.92, up from 1.43 in January.  CBC features a chronic thrombocytopenia and a normocytic anemia.  Troponin and BNP are normal and urinalysis abnormal.  Patient was given 40 mEq of oral potassium in the ED.  He remains hemodynamically stable and will be observed for ongoing evaluation and management.  Review of Systems:  All other systems reviewed and  apart from HPI, are negative.  Past Medical History:  Diagnosis Date  . Anemia, unspecified   . Atrial fibrillation ([place])   . Atypical chest pain 06/30/2018  . CKD (chronic kidney disease) stage 3, GFR 30-59 ml/min ([place]) 06/30/2018  . Dysrhythmia   . Essential hypertension 06/30/2018  . H/O TIA (transient ischemic attack) and stroke 06/30/2005  . Hemorrhoids   . History of colon polyps 05-28-2007   Adenomatous polyp(Colonoscopy with Dr. Lajoyce Corners)  . Hx of adenomatous colonic polyps 02/24/2003   2004,2006,2009 Dr. Lajoyce Corners  . Hyperlipemia   . Hypertension   . Internal hemorrhoids, bleeding and Grade 3 prolapse 12/09/2014  . Wears dentures     Past Surgical History:  Procedure Laterality Date  . CARDIOVERSION N/A 03/02/2015   Procedure: CARDIOVERSION;  Surgeon: Adrian Prows, MD;  Location: [place];  Service: Cardiovascular;  Laterality: N/A;  . COLONOSCOPY  2009  . HEMORRHOID BANDING  2016  . INGUINAL HERNIA REPAIR Left 08/10/2015   Procedure: LEFT INGUINAL HERNIA REPAIR WITH MESH;  Surgeon: Donnie Mesa, MD;  Location: [place];  Service: General;  Laterality: Left;  . INSERTION OF MESH Left 08/10/2015   Procedure: INSERTION OF MESH;  Surgeon: Donnie Mesa, MD;  Location: [place];  Service: General;  Laterality: Left;  Marland Kitchen MULTIPLE TOOTH EXTRACTIONS       reports that he has quit smoking. He has never used smokeless tobacco. He reports current alcohol use of about 4.0 standard drinks of alcohol per week. He reports that he does not use drugs.  No Known Allergies  Family History  Problem Relation Age of Onset  . Diabetes Daughter   . Cervical cancer Sister   . Colon cancer Neg Hx   . Colon polyps Neg Hx      Prior to Admission medications   Medication Sig Start Date End Date Taking? Authorizing Provider  diltiazem (CARDIZEM CD) 300 MG 24 hr capsule Take 300 mg by mouth daily.    [provider]  edoxaban (SAVAYSA) 60 MG TABS tablet Take 60 mg by mouth daily.    [provider]  fluticasone (FLONASE) 50 MCG/ACT nasal spray Place 1 spray into both nostrils daily. 05/07/17   Zigmund Gottron, NP  hydrALAZINE (APRESOLINE) 50 MG tablet Take 1 tablet (50 mg total) by mouth 3 (three) times daily. 07/01/18   Adrian Prows, MD  HYDROcodone-acetaminophen (NORCO/VICODIN) 5-325 MG tablet Take 1 tablet by mouth every 4 (four) hours as needed. 08/10/15   Donnie Mesa, MD  hydrocortisone (ANUSOL-HC) 2.5 % rectal cream Place 1 application rectally daily as needed for hemorrhoids or itching.    [provider]  loratadine (CLARITIN) 10 MG tablet Take 10 mg by mouth daily as needed for allergies.     [provider]  pravastatin (PRAVACHOL) 80 MG tablet Take 80 mg by mouth daily. 04/30/15   [provider]  traMADol (ULTRAM) 50 MG tablet Take 1 tablet (50 mg total) by mouth every 6 (six) hours as needed. 02/05/14   Ernestina Patches, MD  valsartan-hydrochlorothiazide (DIOVAN-HCT) 320-12.5 MG per tablet Take 1 tablet by mouth daily. 01/11/15   [provider]    Physical Exam: Vitals:   07/20/18 2045 07/20/18 2100 07/20/18 2115 07/20/18 2331  BP: 115/72 129/79 122/79 (!) 160/86  Pulse: (!) 54 62 (!) 56 68  Resp: 13 (!) 21 12 19   Temp:    (!) 97.3 F (36.3 C)  TempSrc:    Oral  SpO2: 97% 97% 98% 100%    Constitutional: NAD, calm  Eyes: PERTLA, lids and conjunctivae normal ENMT: Mucous membranes are moist. Posterior pharynx clear of any exudate or lesions.   Neck: normal, supple, no masses, no thyromegaly Respiratory: clear to auscultation bilaterally, no wheezing, no crackles. Normal respiratory effort.    Cardiovascular: Rate ~60 and irregular. No extremity edema.   Abdomen: No distension, no tenderness, soft. Bowel sounds normal.  Musculoskeletal: no clubbing / cyanosis. No joint deformity upper and lower extremities.   Skin: no significant rashes, lesions, ulcers. Warm, dry, well-perfused. Neurologic: No facial asymmetry.  Sensation intact. Strength 5/5 in all 4 limbs.  Psychiatric: Alert and oriented to person, place, and situation. Calm, cooperative.    Labs on Admission: I have personally reviewed following labs and imaging studies  CBC: Recent Labs  Lab 07/20/18 2037  WBC 4.0  HGB 10.8*  HCT 32.7*  MCV 88.1  PLT 557*   Basic Metabolic Panel: Recent Labs  Lab 07/20/18 2037  NA 139  K 3.2*  CL 104  CO2 24  GLUCOSE 126*  BUN 25*  CREATININE 1.92*  CALCIUM 8.5*  MG 2.0   GFR: CrCl cannot be calculated (Unknown ideal weight.). Liver Function Tests: No results for input(s): AST, ALT, ALKPHOS, BILITOT, PROT, ALBUMIN in the last 168 hours. No results for input(s): LIPASE, AMYLASE in the last 168 hours. No results for input(s): AMMONIA in the last 168 hours. Coagulation Profile: No results for input(s): INR, PROTIME in the last 168 hours. Cardiac Enzymes: Recent Labs  Lab 07/20/18 2037  TROPONINI <0.03   BNP (last 3 results) No results for input(s): PROBNP in the last 8760 hours. HbA1C: No results for input(s): HGBA1C in the last 72 hours. CBG: Recent Labs  Lab 07/20/18 2043  GLUCAP 110*   Lipid Profile: No results for input(s): CHOL, HDL, LDLCALC, TRIG, CHOLHDL, LDLDIRECT in the last 72 hours. Thyroid Function Tests: No results for input(s): TSH, T4TOTAL, FREET4, T3FREE, THYROIDAB in the last 72 hours. Anemia Panel: No results for input(s): VITAMINB12, FOLATE, FERRITIN, TIBC, IRON, RETICCTPCT in the last 72 hours. Urine analysis:    Component Value Date/Time   COLORURINE YELLOW 07/20/2018 2153   APPEARANCEUR CLEAR 07/20/2018 2153   LABSPEC 1.013 07/20/2018 2153   PHURINE 6.0 07/20/2018 2153   GLUCOSEU NEGATIVE 07/20/2018 2153   HGBUR NEGATIVE 07/20/2018 2153   BILIRUBINUR NEGATIVE 07/20/2018 2153   [place] NEGATIVE 07/20/2018 2153   PROTEINUR NEGATIVE 07/20/2018 2153   NITRITE NEGATIVE 07/20/2018 2153   LEUKOCYTESUR NEGATIVE 07/20/2018 2153   Sepsis  Labs: @LABRCNTIP (procalcitonin:4,lacticidven:4) )No results found for this or any previous visit (from the past 240 hour(s)).   Radiological Exams on Admission: Dg Chest Portable 1 View  Result Date: 07/20/2018 CLINICAL DATA:  Syncope EXAM: PORTABLE CHEST 1 VIEW COMPARISON:  05/22/2018 chest radiograph. FINDINGS: Stable cardiomediastinal silhouette with mild cardiomegaly. No pneumothorax. No pleural effusion. No pulmonary edema. Stable mild eventrations of the hemidiaphragms bilaterally. Mild left basilar atelectasis. IMPRESSION: Mild left basilar atelectasis. Mild cardiomegaly without pulmonary edema. Electronically Signed   By: Ilona Sorrel M.D.   On: 07/20/2018 20:51    EKG: Independently reviewed. Atrial fibrillation.   Assessment/Plan   1. Near-syncope  - Presents with one day of recurrent presyncopal episodes  - There has not been any chest pain or palpitations associated with this  - EKG with atrial fibrillation, rate 53  - Patient reports recently starting two new medications but unable to recall the names or what they were for  - Check orthostatic vitals, continue cardiac monitoring, follow-up pharmacy med rec, update echocardiogram    2. Atrial fibrillation  - In atrial fibrillation in ED with rates 40's-50's  - CHADS-VASc at least 5 (age x2, TIA x2, HTN)  - Continue edoxaban    3. Hypertension  - BP at goal  - Hold valsartan-HCTZ while hydrating and hold diltiazem for now in light of bradycardia    4. CKD III  - SCr is 1.92 on admission, up from 1.43 in January  - Check urine chemistries, hold valsartan-HCTZ, continue gentle IVF hydration, renally-dose medications, and repeat chem panel in am    5. Hypokalemia  - Serum potassium is 3.2 and treated with 40 mEq oral potassium in ED  - Magnesium is normal  - Repeat chem panel in am    6. History of TIA   - Denies any focal neurologic deficits  - Continue statin and anticoagulation     DVT prophylaxis: edoxaban   Code Status: Full  Family Communication: Daughter updated at bedside Consults called: None Admission status: Observation     Vianne Bulls, MD [facility] Pager [phone]  If 7PM-7AM, please contact night-coverage [email or web address] Password [facility]  07/21/2018, 12:55 AM

## 2018-07-21 NOTE — Progress Notes (Signed)
Echocardiogram 2D Echocardiogram has been performed.  Adrian Daniels 07/21/2018, 11:19 AM

## 2018-07-21 NOTE — Progress Notes (Signed)
Patient discharged in stable condition. AVS reviewed and questions answered. IV removed intact.

## 2018-07-21 NOTE — Discharge Summary (Addendum)
Physician Discharge Summary  Adrian Daniels JSH:[id number] DOB: 04/25/1935 DOA: 07/20/2018  PCP: [name], MD  Admit date: 07/20/2018 Discharge date: 07/21/2018  Time spent: 45 minutes  Recommendations for Outpatient Follow-up:  Patient will be discharged to home.  Patient will need to follow up with primary care provider within one week of discharge, repeat BMP.  Patient should continue medications as prescribed.  Patient should follow a heart healthy diet.   Discharge Diagnoses:  Principal Problem:   Near syncope Active Problems:   Atrial fibrillation (HCC)   H/O TIA (transient ischemic attack) and stroke   Essential hypertension   CKD (chronic kidney disease) stage 3, GFR 30-59 ml/min ([facility])   Hypokalemia   Discharge Condition: stable  Diet recommendation: heart healthy  Filed Weights   07/21/18 0300 07/21/18 0447  Weight: 84.2 kg 81.7 kg    History of present illness:  On 07/20/2018 by Dr. Winifred Olive Browning is a 84 y.o. male with medical history significant for atrial fibrillation on edoxaba, chronic kidney disease stage III, hypertension, and history of TIA, now presenting to the emergency department with near syncope and nausea.  Patient reports that he was recently started on 2 new medications, but is unable to recall the names of these, with therefore, or who prescribed them.  He had continued to be in his usual state until earlier on the day of presentation when he was out shopping and became acutely lightheaded, feeling as though he was about to pass out.  He was assisted to a sitting position by a bystander and began to feel better.  He denies any associated chest pain or palpitations, reports some associated nausea without vomiting, and denies any headache, change in vision or hearing, or focal numbness or weakness.  He denies any recent shortness of breath.  Reports that he went on to have similar episodes throughout the evening, both with standing and while  seated.  There was never any chest pain or palpitations associated with this.  Hospital Course:  Near syncope -Possibly secondary to new medications although patient unable to recall the names of these medicines. -unknown etiology, no source of infection -Orthostatic vitals unremarkable -Echocardiogram EF of 55 to 60%, mildly increased LV wall thickness.  LV diastolic Doppler parameters are indeterminate in the setting of atrial fibrillation.  Mild aortic valve regurgitation.  Mild to moderate dilatation of aortic root. -PT consulted, no further therapy recommended  Atrial fibrillation -Currently rate controlled -CHADSVASC 5 (based on age, TIA, hypertension) -Continue Eliquis- patient no longer on edoxaban  Essential hypertension -Continue home medications on discharge  Chronic kidney disease, stage III -Creatinine appears stable, currently 1.57  Hypokalemia -Resolved with replacement, magnesium checked - 2 -Repeat BMP in 1 week  History of TIA -No focal neurological deficits -Continue statin,Eliquis  Procedures: Echocardiogram  Consultations: None  Discharge Exam: Vitals:   07/21/18 1343 07/21/18 1410  BP: 135/87 139/89  Pulse: 66 (!) 54  Resp:    Temp: 99 F (37.2 C) (!) 97.5 F (36.4 C)  SpO2: 96% 99%     General: Well developed, well nourished, NAD, appears stated age  HEENT: NCAT, mucous membranes moist.  Neck: Supple  Cardiovascular: S1 S2 auscultated, irregular  Respiratory: Clear to auscultation bilaterally with equal chest rise  Abdomen: Soft, nontender, nondistended, + bowel sounds  Extremities: warm dry without cyanosis clubbing or edema  Neuro: AAOx3, nonfoacl  Psych: Normal affect and demeanor with intact judgement and insight  Discharge Instructions Discharge Instructions  Discharge instructions   Complete by:  As directed    Patient will be discharged to home.  Patient will need to follow up with primary care provider within one  week of discharge, repeat BMP.  Follow up with Dr. Nadyne Coombes, cardiology. Patient should continue medications as prescribed.  Patient should follow a heart healthy diet.     Allergies as of 07/21/2018   No Known Allergies     Medication List    TAKE these medications   alfuzosin 10 MG 24 hr tablet Commonly known as:  UROXATRAL Take 10 mg by mouth daily.   apixaban 5 MG Tabs tablet Commonly known as:  ELIQUIS Take 5 mg by mouth 2 (two) times daily.   diltiazem 300 MG 24 hr capsule Commonly known as:  CARDIZEM CD Take 300 mg by mouth daily.   hydrALAZINE 50 MG tablet Commonly known as:  APRESOLINE Take 1 tablet (50 mg total) by mouth 3 (three) times daily.   losartan-hydrochlorothiazide 100-12.5 MG tablet Commonly known as:  HYZAAR Take 1 tablet by mouth daily.   pravastatin 80 MG tablet Commonly known as:  PRAVACHOL Take 80 mg by mouth daily.      No Known Allergies Follow-up Information    [name], MD. Schedule an appointment as soon as possible for a visit in 1 week(s).   Specialty:  Internal Medicine Why:  Hospital follow up Contact information: [address] [place] [address] [phone]            The results of significant diagnostics from this hospitalization (including imaging, microbiology, ancillary and laboratory) are listed below for reference.    Significant Diagnostic Studies: Dg Chest Portable 1 View  Result Date: 07/20/2018 CLINICAL DATA:  Syncope EXAM: PORTABLE CHEST 1 VIEW COMPARISON:  05/22/2018 chest radiograph. FINDINGS: Stable cardiomediastinal silhouette with mild cardiomegaly. No pneumothorax. No pleural effusion. No pulmonary edema. Stable mild eventrations of the hemidiaphragms bilaterally. Mild left basilar atelectasis. IMPRESSION: Mild left basilar atelectasis. Mild cardiomegaly without pulmonary edema. Electronically Signed   By: Ilona Sorrel M.D.   On: 07/20/2018 20:51    Microbiology: No results found for  this or any previous visit (from the past 240 hour(s)).   Labs: Basic Metabolic Panel: Recent Labs  Lab 07/20/18 2037 07/21/18 0528  NA 139 140  K 3.2* 3.5  CL 104 107  CO2 24 26  GLUCOSE 126* 77  BUN 25* 21  CREATININE 1.92* 1.57*  CALCIUM 8.5* 8.4*  MG 2.0  --    Liver Function Tests: No results for input(s): AST, ALT, ALKPHOS, BILITOT, PROT, ALBUMIN in the last 168 hours. No results for input(s): LIPASE, AMYLASE in the last 168 hours. No results for input(s): AMMONIA in the last 168 hours. CBC: Recent Labs  Lab 07/20/18 2037 07/21/18 0528  WBC 4.0 4.7  HGB 10.8* 10.1*  HCT 32.7* 29.8*  MCV 88.1 86.4  PLT 122* 124*   Cardiac Enzymes: Recent Labs  Lab 07/20/18 2037  TROPONINI <0.03   BNP: BNP (last 3 results) Recent Labs    07/20/18 2037  BNP 72.3    ProBNP (last 3 results) No results for input(s): PROBNP in the last 8760 hours.  CBG: Recent Labs  Lab 07/20/18 2043 07/21/18 0757  GLUCAP 110* 74       Signed:  Shayde Gervacio  [facility] 07/21/2018, 2:21 PM

## 2018-07-21 NOTE — Discharge Instructions (Signed)
Near-Syncope °Near-syncope is when you suddenly get weak or dizzy, or you feel like you might pass out (faint). This is due to a lack of blood flow to the brain. During an episode of near-syncope, you may: °· Feel dizzy or light-headed. °· Feel sick to your stomach (nauseous). °· See all white or all black. °· Have cold, clammy skin. °This condition is caused by a sudden decrease in blood flow to the brain. This decrease can result from various causes, but most of those causes are not dangerous. However, near-syncope may be a sign of a serious medical problem, so it is important to seek medical care. °Follow these instructions at home: °Pay attention to any changes in your symptoms. Take these actions to help with your condition: °· Have someone stay with you until you feel stable. °· Talk with your doctor about your symptoms. You may need to have testing to understand the cause of your near-syncope. °· Do not drive, use machinery, or play sports until your doctor says it is okay. °· Keep all follow-up visits as told by your doctor. This is important. °· If you start to feel like you might pass out, lie down right away and raise (elevate) your feet above the level of your heart. Breathe deeply and steadily. Wait until all of the symptoms are gone. °· Drink enough fluid to keep your pee (urine) pale yellow. °Medicines °· If you are taking blood pressure or heart medicine, get up slowly and spend many minutes getting ready to sit and then stand. This can help with dizziness. °· Take over-the-counter and prescription medicines only as told by your doctor. °Get help right away if you: °· Have a seizure. °· Have pain in your: °? Chest. °? Tummy, °? Back. °· Faint. °· Have a bad headache. °· Are bleeding from your mouth or butt. °· Have black or tarry poop (stool). °· Have a very fast or uneven heartbeat (palpitations). °· Are confused. °· Have trouble walking. °· Are very weak. °· Have trouble seeing. °These symptoms may  represent a serious problem that is an emergency. Do not wait to see if your symptoms will go away. Get medical help right away. Call your local emergency services (911 in the [place] Do not drive yourself to the hospital. °Summary °· Near-syncope is when you suddenly get weak or dizzy, or you feel like you might pass out. °· This condition is caused by a lack of blood flow to the brain. °· Near-syncope may be a sign of a serious medical problem, so it is important to seek medical care. °This information is not intended to replace advice given to you by your health care provider. Make sure you discuss any questions you have with your health care provider. °Document Released: 10/18/2007 Document Revised: 12/25/2017 Document Reviewed: 01/13/2015 °Elsevier Interactive Patient Education © 2019 [employer] ° °

## 2018-07-21 NOTE — Evaluation (Signed)
Physical Therapy Evaluation Patient Details Name: Adrian Daniels MRN: [id number] DOB: Aug 28, 1934 Today's Date: 07/21/2018   History of Present Illness  Pt came in under observation status for near syncope and nausea. PMH significant for CKD, HTN, TIA  Clinical Impression  Pt presents to PT at baseline level of function, walking independently with no balance issues noted. No f/u recomendations.  PT to sign off.       Follow Up Recommendations No PT follow up    Equipment Recommendations  None recommended by PT    Recommendations for Other Services       Precautions / Restrictions Precautions Precautions: Fall Restrictions Weight Bearing Restrictions: No      Mobility  Bed Mobility Overal bed mobility: Independent                Transfers Overall transfer level: Independent Equipment used: None                Ambulation/Gait Ambulation/Gait assistance: Independent Gait Distance (Feet): 200 Feet Assistive device: None Gait Pattern/deviations: Step-through pattern     General Gait Details: no difficuties with gait or balance. Pt reports he is at basleine level of mobility  Stairs            Wheelchair Mobility    Modified Rankin (Stroke Patients Only)       Balance Overall balance assessment: Independent                                           Pertinent Vitals/Pain Pain Assessment: No/denies pain    Home Living Family/patient expects to be discharged to:: Private residence Living Arrangements: Alone                    Prior Function Level of Independence: Independent(still works part time, drives)               Journalist, newspaper        Extremity/Trunk Assessment   Upper Extremity Assessment Upper Extremity Assessment: Overall WFL for tasks assessed    Lower Extremity Assessment Lower Extremity Assessment: Overall WFL for tasks assessed    Cervical / Trunk Assessment Cervical / Trunk  Assessment: Normal  Communication   Communication: No difficulties  Cognition Arousal/Alertness: Awake/alert Behavior During Therapy: WFL for tasks assessed/performed Overall Cognitive Status: Within Functional Limits for tasks assessed                                        General Comments      Exercises     Assessment/Plan    PT Assessment Patent does not need any further PT services  PT Problem List         PT Treatment Interventions      PT Goals (Current goals can be found in the Care Plan section)       Frequency     Barriers to discharge        Co-evaluation               AM-PAC PT "6 Clicks" Mobility  Outcome Measure Help needed turning from your back to your side while in a flat bed without using bedrails?: None Help needed moving from lying on your back to sitting on the side of a flat bed without  using bedrails?: None Help needed moving to and from a bed to a chair (including a wheelchair)?: None Help needed standing up from a chair using your arms (e.g., wheelchair or bedside chair)?: None Help needed to walk in hospital room?: None Help needed climbing 3-5 steps with a railing? : None 6 Click Score: 24    End of Session   Activity Tolerance: Patient tolerated treatment well Patient left: in bed;with call bell/phone within reach Nurse Communication: Mobility status PT Visit Diagnosis: Unsteadiness on feet (R26.81)    Time: 1343-1400 PT Time Calculation (min) (ACUTE ONLY): 17 min   Charges:   PT Evaluation $PT Eval Low Complexity: [place], PT   Acute Rehabilitation Services  Pager [phone] Office [phone] 07/21/2018   Melvern Banker 07/21/2018, 2:06 PM

## 2019-01-01 ENCOUNTER — Encounter: Payer: Self-pay | Admitting: Cardiology

## 2019-01-01 ENCOUNTER — Other Ambulatory Visit: Payer: Self-pay

## 2019-01-01 ENCOUNTER — Ambulatory Visit (INDEPENDENT_AMBULATORY_CARE_PROVIDER_SITE_OTHER): Payer: Medicare Other | Admitting: Cardiology

## 2019-01-01 VITALS — BP 130/82 | HR 70 | Ht 71.0 in | Wt 173.0 lb

## 2019-01-01 DIAGNOSIS — I129 Hypertensive chronic kidney disease with stage 1 through stage 4 chronic kidney disease, or unspecified chronic kidney disease: Secondary | ICD-10-CM

## 2019-01-01 DIAGNOSIS — N183 Chronic kidney disease, stage 3 unspecified: Secondary | ICD-10-CM

## 2019-01-01 DIAGNOSIS — I10 Essential (primary) hypertension: Secondary | ICD-10-CM

## 2019-01-01 DIAGNOSIS — I4821 Permanent atrial fibrillation: Secondary | ICD-10-CM | POA: Diagnosis not present

## 2019-01-01 NOTE — Progress Notes (Signed)
Subjective:  Primary Physician:  [name], MD  Patient ID: Adrian Daniels, male    DOB: [birth year], 84 y.o.   MRN: [id number]  Chief Complaint  Patient presents with  . Atrial Fibrillation  . Follow-up    HPI: Adrian Daniels  is a 84 y.o. male  with  history of chronic atrial fibrillation, hypertension, hyperlipidemia,. Patient is on Eliquis and tolerating this without bleeding complications.  On his last office visit 6 month ago after his hospital visit with atypical chest pain, elevated blood pressure was felt to be an issue, and added hydralazine.  Patient developed significant dizziness and discontinued this.  He now presents for follow-up and states that he is feeling the best he has in a while, except for unintentional weight loss he has no specific concerns today.  Past Medical History:  Diagnosis Date  . Anemia, unspecified   . Atrial fibrillation ([place])   . Atypical chest pain 06/30/2018  . CKD (chronic kidney disease) stage 3, GFR 30-59 ml/min ([place]) 06/30/2018  . Dysrhythmia   . Essential hypertension 06/30/2018  . H/O TIA (transient ischemic attack) and stroke 06/30/2005  . Hemorrhoids   . History of colon polyps 05-28-2007   Adenomatous polyp(Colonoscopy with Dr. Lajoyce Corners)  . Hx of adenomatous colonic polyps 02/24/2003   2004,2006,2009 Dr. Lajoyce Corners  . Hyperlipemia   . Hypertension   . Internal hemorrhoids, bleeding and Grade 3 prolapse 12/09/2014  . Wears dentures     Past Surgical History:  Procedure Laterality Date  . CARDIOVERSION N/A 03/02/2015   Procedure: CARDIOVERSION;  Surgeon: Adrian Prows, MD;  Location: [place];  Service: Cardiovascular;  Laterality: N/A;  . COLONOSCOPY  2009  . HEMORRHOID BANDING  2016  . INGUINAL HERNIA REPAIR Left 08/10/2015   Procedure: LEFT INGUINAL HERNIA REPAIR WITH MESH;  Surgeon: Donnie Mesa, MD;  Location: [place];  Service: General;  Laterality: Left;  . INSERTION OF MESH Left 08/10/2015   Procedure: INSERTION OF MESH;  Surgeon:  Donnie Mesa, MD;  Location: [place];  Service: General;  Laterality: Left;  Marland Kitchen MULTIPLE TOOTH EXTRACTIONS      Social History   Socioeconomic History  . Marital status: Single    Spouse name: Not on file  . Number of children: 8  . Years of education: Not on file  . Highest education level: Not on file  Occupational History  . Not on file  Social Needs  . Financial resource strain: Not on file  . Food insecurity    Worry: Not on file    Inability: Not on file  . Transportation needs    Medical: Not on file    Non-medical: Not on file  Tobacco Use  . Smoking status: Former Smoker    Packs/day: 0.50    Years: 30.00    Pack years: 15.00    Types: Cigarettes  . Smokeless tobacco: Never Used  Substance and Sexual Activity  . Alcohol use: Yes    Alcohol/week: 4.0 standard drinks    Types: 4 Standard drinks or equivalent per week    Comment: 4 per week  . Drug use: No  . Sexual activity: Not on file  Lifestyle  . Physical activity    Days per week: Not on file    Minutes per session: Not on file  . Stress: Not on file  Relationships  . Social Herbalist on phone: Not on file    Gets together: Not on file  Attends religious service: Not on file    Active member of club or organization: Not on file    Attends meetings of clubs or organizations: Not on file    Relationship status: Not on file  . Intimate partner violence    Fear of current or ex partner: Not on file    Emotionally abused: Not on file    Physically abused: Not on file    Forced sexual activity: Not on file  Other Topics Concern  . Not on file  Social History Narrative  . Not on file    Current Outpatient Medications on File Prior to Visit  Medication Sig Dispense Refill  . apixaban (ELIQUIS) 5 MG TABS tablet Take 5 mg by mouth 2 (two) times daily.    Marland Kitchen diltiazem (CARDIZEM CD) 300 MG 24 hr capsule Take 300 mg by mouth daily.    Marland Kitchen losartan-hydrochlorothiazide (HYZAAR) 100-12.5 MG tablet  Take 1 tablet by mouth daily.    . pravastatin (PRAVACHOL) 80 MG tablet Take 80 mg by mouth daily.  3  . alfuzosin (UROXATRAL) 10 MG 24 hr tablet Take 10 mg by mouth daily.     No current facility-administered medications on file prior to visit.    Review of Systems  Constitutional: Positive for weight loss. Negative for malaise/fatigue.  HENT: Negative for sinus pain.   Respiratory: Negative for cough, hemoptysis and shortness of breath.   Cardiovascular: Negative for chest pain, palpitations, claudication and leg swelling.  Gastrointestinal: Negative for abdominal pain, blood in stool, constipation, heartburn and vomiting.  Genitourinary: Positive for frequency. Negative for dysuria.  Musculoskeletal: Positive for back pain (chronic). Negative for joint pain and myalgias.  Neurological: Negative for dizziness, focal weakness and headaches.  Endo/Heme/Allergies: Does not bruise/bleed easily.  Psychiatric/Behavioral: Negative for depression. The patient is not nervous/anxious.   All other systems reviewed and are negative.   Objective:  Blood pressure (!) 141/82, pulse 70, height 5' 11"  (1.803 m), weight 173 lb (78.5 kg), SpO2 98 %. Body mass index is 24.13 kg/m.  Physical Exam  Constitutional: He appears well-developed and well-nourished. No distress.  HENT:  Head: Atraumatic.  Eyes: Conjunctivae are normal.  Neck: Neck supple. No JVD present. No thyromegaly present.  Cardiovascular: Normal rate and intact distal pulses. An irregularly irregular rhythm present. Exam reveals no gallop.  No murmur heard. Pulses:      Carotid pulses are 2+ on the right side and 2+ on the left side.      Femoral pulses are 2+ on the right side and 2+ on the left side.      Popliteal pulses are 1+ on the right side and 2+ on the left side.       Dorsalis pedis pulses are 1+ on the right side and 2+ on the left side.       Posterior tibial pulses are 0 on the right side and 1+ on the left side.   Variable S1 and normal S2. There is 2+ pitting edema bilateral ankles.   Pulmonary/Chest: Effort normal and breath sounds normal.  Abdominal: Soft. Bowel sounds are normal.  Musculoskeletal: Normal range of motion.        General: No edema.  Neurological: He is alert.  Skin: Skin is warm and dry.  Psychiatric: He has a normal mood and affect.   CARDIAC STUDIES:  Exercise myoview stress 06/05/2018:  1. The patient performed treadmill exercise using Bruce protocol, completing 5:07 minutes. The patient completed an estimated workload of  7 METS, reaching 92% of the maximum predicted heart rate. Exercise capacity was low.. Resting hypertension 142/106 mmHg, with exaggerated exercise response, peak BP reaching 220/80 mmHg. No stress symptoms reported. No ischemic changes seen on stress electrocardiogram.  2. The overall quality of the study is good. There is no evidence of abnormal lung activity. Stress and rest SPECT images demonstrate homogeneous tracer distribution throughout the myocardium. Gated SPECT imaging reveals normal myocardial thickening and wall motion. The left ventricular ejection fraction was calculated 48%, although visually appears normal.  3. Low risk study.  Echocardiogram 06/10/2018: Left ventricle cavity is normal in size. Mild concentric hypertrophy of the left ventricle. Normal global wall motion. Unable to evaluate diastolic function due to A. Fibrillation. Calculated EF 59%. Left atrial cavity is severely dilated. Right atrial cavity is moderately dilated. Mild (Grade I) aortic regurgitation. Moderate (Grade II) mitral regurgitation. Moderate tricuspid regurgitation. Estimated pulmonary artery systolic pressure 27  mmHg. The aortic root is dilated, ascending aorta measuring 4.3 cm. Compared to previous study in 2016, biatrial enlargement and valvular regurgitation increased in severity.  Lower extremity arterial duplex 01/27/2015: No hemodynamically significant  stenoses are identified in the bilateral lower extremity arterial system.This exam reveals normal perfusion of both the lower extremities with RABI 1.03 and LABI 0.96.  Assessment & Recommendations:     ICD-10-CM   1. Permanent atrial fibrillation  I48.21 EKG 12-Lead   CHA2DS2-VASc Score is 6. Yearly risk of stroke 9.8%  2. Essential hypertension  I10   3. CKD (chronic kidney disease) stage 3, GFR 30-59 ml/min (HCC)  N18.3    EKG 01/01/2019: Atrial fibrillation with controlled ventricular response at the rate of 70 bpm, normal axis, no evidence of ischemia, normal QT interval.   Laboratory examination Labs 05/22/2018: Serum glucose 13 mg, potassium 3.7, BUN 18, creatinine 1.43, eGFR 53 mL.  HB 11.4/HCT 34.2, platelets 05/25/25.  Normal indicis.  10/18/2016: Creatinine 1.5, EGFR 50, potassium 3.9, BMP otherwise normal.  Hemoglobin 12.5, hematocrit 35.0, CBC otherwise normal.   Recommendation:  Patient is presently in permanent atrial fibrillation and essentially asymptomatic.  Blood pressure is also well controlled.  He has lost about 8-10 pounds in weight over the past 6 months and it is unintentional.  He'll make an appointment to see Dr. Shelia Media.  With regard to anti-correlation, he is presently on appropriate dose of Eliquis.  He has had labs done 6 months ago, stable renal function, stable CBC.  Continue the same.  As he remains asymptomatic, I'll see him back in 6 months for follow-up of atrial fibrillation.  If he remains stable, I could see him back on an annual basis.  Adrian Prows, MD, [facility] 01/01/2019, 1:17 PM [facility] [place] Pager: [phone] Office: [phone] If no answer Cell [phone]

## 2019-05-26 DIAGNOSIS — N529 Male erectile dysfunction, unspecified: Secondary | ICD-10-CM | POA: Diagnosis not present

## 2019-05-26 DIAGNOSIS — I10 Essential (primary) hypertension: Secondary | ICD-10-CM | POA: Diagnosis not present

## 2019-05-26 DIAGNOSIS — N401 Enlarged prostate with lower urinary tract symptoms: Secondary | ICD-10-CM | POA: Diagnosis not present

## 2019-05-26 DIAGNOSIS — E7800 Pure hypercholesterolemia, unspecified: Secondary | ICD-10-CM | POA: Diagnosis not present

## 2019-05-26 DIAGNOSIS — K649 Unspecified hemorrhoids: Secondary | ICD-10-CM | POA: Diagnosis not present

## 2019-05-30 DIAGNOSIS — Z1212 Encounter for screening for malignant neoplasm of rectum: Secondary | ICD-10-CM | POA: Diagnosis not present

## 2019-06-07 ENCOUNTER — Ambulatory Visit: Payer: Medicare Other | Attending: Internal Medicine

## 2019-06-07 DIAGNOSIS — Z23 Encounter for immunization: Secondary | ICD-10-CM

## 2019-07-05 ENCOUNTER — Ambulatory Visit: Payer: Medicare Other

## 2019-07-09 ENCOUNTER — Ambulatory Visit: Payer: Medicare Other | Admitting: Cardiology

## 2019-07-09 ENCOUNTER — Encounter: Payer: Self-pay | Admitting: Cardiology

## 2019-07-09 ENCOUNTER — Other Ambulatory Visit: Payer: Self-pay

## 2019-07-09 VITALS — BP 130/83 | HR 71 | Temp 97.6°F | Ht 71.0 in | Wt 171.0 lb

## 2019-07-09 DIAGNOSIS — N1832 Chronic kidney disease, stage 3b: Secondary | ICD-10-CM | POA: Diagnosis not present

## 2019-07-09 DIAGNOSIS — I4821 Permanent atrial fibrillation: Secondary | ICD-10-CM

## 2019-07-09 DIAGNOSIS — I10 Essential (primary) hypertension: Secondary | ICD-10-CM | POA: Diagnosis not present

## 2019-07-09 NOTE — Progress Notes (Signed)
Primary Physician/Referring:  [name], MD  Patient ID: Adrian Daniels, male    DOB: 09-26-1934, 85 y.o.   MRN: [id number]  Chief Complaint  Patient presents with  . Atrial Fibrillation  . Follow-up    75mo  HPI:    Adrian Daniels is a 85y.o. AAM patient with  history of chronic atrial fibrillation, hypertension, hyperlipidemia. Patient is on Eliquis and tolerating this without bleeding complications.  His past medical history significant for stage III chronic kidney disease, essential hypertension, history of TIA and stroke in 2007 and history of colonic polyps and hyperlipidemia.  He is presently doing well and has not had a bleeding diathesis Eliquis.  No chest pain, palpitations, dizziness or syncope.  No GI bleeding.  Past Medical History:  Diagnosis Date  . Anemia, unspecified   . Atrial [place] ([place]   . Atypical chest pain 06/30/2018  . CKD (chronic kidney disease) stage 3, GFR 30-59 ml/min 06/30/2018  . Dysrhythmia   . Essential hypertension 06/30/2018  . H/O TIA (transient ischemic attack) and stroke 06/30/2005  . Hemorrhoids   . History of colon polyps 05-28-2007   Adenomatous polyp(Colonoscopy with Dr. OLajoyce Corners  . Hx of adenomatous colonic polyps 02/24/2003   2004,2006,2009 Dr. OLajoyce Corners . Hyperlipemia   . Hypertension   . Internal hemorrhoids, bleeding and Grade 3 prolapse 12/09/2014  . Wears dentures    Past Surgical History:  Procedure Laterality Date  . CARDIOVERSION N/A 03/02/2015   Procedure: CARDIOVERSION;  Surgeon: JAdrian Prows MD;  [place]  Service: Cardiovascular;  Laterality: N/A;  . COLONOSCOPY  2009  . HEMORRHOID BANDING  2016  . INGUINAL HERNIA REPAIR Left 08/10/2015   Procedure: LEFT INGUINAL HERNIA REPAIR WITH MESH;  Surgeon: MDonnie Mesa MD;  [place]  Service: General;  Laterality: Left;  . INSERTION OF MESH Left 08/10/2015   Procedure: INSERTION OF MESH;  Surgeon: MDonnie Mesa MD;  [place]  Service: General;   Laterality: Left;  .Marland KitchenMULTIPLE TOOTH EXTRACTIONS     Family History  Problem Relation Age of Onset  . Diabetes Daughter   . Cervical cancer Sister   . Colon cancer Neg Hx   . Colon polyps Neg Hx     Social History   Tobacco Use  . Smoking status: Former Smoker    Packs/day: 0.50    Years: 30.00    Pack years: 15.00    Types: Cigarettes    Quit date: 07/08/1989    Years since quitting: 30.0  . Smokeless tobacco: Never Used  Substance Use Topics  . Alcohol use: Yes    Alcohol/week: 4.0 standard drinks    Types: 4 Standard drinks or equivalent per week    Comment: 4 per week   ROS  Review of Systems  Cardiovascular: Negative for chest pain, dyspnea on exertion and leg swelling.  Gastrointestinal: Negative for hematochezia and melena.   Objective  Temperature 97.6 F (36.4 C).  Vitals with BMI 01/01/2019 07/21/2018 07/21/2018  Height 5' 11"  - -  Weight 173 lbs - -  BMI 270.26- -  Systolic [id number] Diastolic 82 89 87  Pulse 70 54 66     Physical Exam  HENT:  Head: Atraumatic.  Cardiovascular: An irregularly irregular rhythm present. Exam reveals no gallop, no S3 and no S4.  No murmur heard. Pulses:      Carotid pulses are 2+ on the right side and 2+ on the left  side.      Dorsalis pedis pulses are 2+ on the right side and 2+ on the left side.       Posterior tibial pulses are 0 on the right side and 0 on the left side.  S1 is variable, S2 is normal. No JVD, No leg edema.  Pulmonary/Chest: Effort normal and breath sounds normal.  Abdominal: Soft. Bowel sounds are normal.   Laboratory examination:   Recent Labs    07/20/18 2037 07/21/18 0528  NA 139 140  K 3.2* 3.5  CL 104 107  CO2 24 26  GLUCOSE 126* 77  BUN 25* 21  CREATININE 1.92* 1.57*  CALCIUM 8.5* 8.4*  GFRNONAA 32* 40*  GFRAA 37* 47*   CrCl cannot be calculated (Patient's Daniels recent lab result is older than the maximum 21 days allowed.).  CMP Latest Ref Rng & Units 07/21/2018 07/20/2018 05/22/2018   Glucose 70 - 99 mg/dL 77 126(H) 103(H)  BUN 8 - 23 mg/dL 21 25(H) 18  Creatinine 0.61 - 1.24 mg/dL 1.57(H) 1.92(H) 1.43(H)  Sodium 135 - 145 mmol/L 140 139 142  Potassium 3.5 - 5.1 mmol/L 3.5 3.2(L) 3.7  Chloride 98 - 111 mmol/L 107 104 107  CO2 22 - 32 mmol/L 26 24 28   Calcium 8.9 - 10.3 mg/dL 8.4(L) 8.5(L) 9.0   CBC Latest Ref Rng & Units 07/21/2018 07/20/2018 05/22/2018  WBC 4.0 - 10.5 K/uL 4.7 4.0 3.2(L)  Hemoglobin 13.0 - 17.0 g/dL 10.1(L) 10.8(L) 11.4(L)  Hematocrit 39.0 - 52.0 % 29.8(L) 32.7(L) 34.2(L)  Platelets 150 - 400 K/uL 124(L) 122(L) 127(L)   Lipid Panel  No results found for: CHOL, TRIG, HDL, CHOLHDL, VLDL, LDLCALC, LDLDIRECT HEMOGLOBIN A1C No results found for: HGBA1C, MPG TSH No results for input(s): TSH in the last 8760 hours.  External labs : Severe labs 04/30/2019:    Hb 12.2/HCT 34.3, platelets 05/16/1935. BUN 17, creatinine 1.4, EGFR 51 mL.  CMP normal. Total cholesterol 160, triglycerides 42, HDL 70, LDL 82.  Non-HDL cholesterol 90.  Labs 05/22/2018: Serum glucose 13 mg, potassium 3.7, BUN 18, creatinine 1.43, eGFR 53 mL.  HB 11.4/HCT 34.2, platelets 05/25/25.  Normal indicis.  10/18/2016: Creatinine 1.5, EGFR 50, potassium 3.9, BMP otherwise normal.  Hemoglobin 12.5, hematocrit 35.0, CBC otherwise normal. Medications and allergies  No Known Allergies   Current Outpatient Medications  Medication Instructions  . apixaban (ELIQUIS) 5 mg, Oral, 2 times daily  . diltiazem (CARDIZEM CD) 300 mg, Oral, Daily  . losartan-hydrochlorothiazide (HYZAAR) 100-12.5 MG tablet 1 tablet, Oral, Daily  . pravastatin (PRAVACHOL) 80 mg, Oral, Daily   Radiology:   CXR 07/20/2018:  Mild left basilar atelectasis. Mild cardiomegaly without pulmonary edema.  Cardiac Studies:   Lower extremity arterial duplex 01/27/2015: No hemodynamically significant stenoses are identified in the bilateral lower extremity arterial system.This exam reveals normal perfusion of both the lower  extremities with RABI 1.03 and LABI 0.96.  Exercise myoview stress 06/05/2018:  1. The patient performed treadmill exercise using Bruce protocol, completing 5:07 minutes. The patient completed an estimated workload of 7 METS, reaching 92% of the maximum predicted heart rate. Exercise capacity was low.. Resting hypertension 142/106 mmHg, with exaggerated exercise response, peak BP reaching 220/80 mmHg. No stress symptoms reported. No ischemic changes seen on stress electrocardiogram.  2. The overall quality of the study is good. There is no evidence of abnormal lung activity. Stress and rest SPECT images demonstrate homogeneous tracer distribution throughout the myocardium. Gated SPECT imaging reveals normal myocardial thickening and  wall motion. The left ventricular ejection fraction was calculated 48%, although visually appears normal.  3. Low risk study.  Echocardiogram 06/10/2018: Left ventricle cavity is normal in size. Mild concentric hypertrophy of the left ventricle. Normal global wall motion. Unable to evaluate diastolic function due to A. Fibrillation. Calculated EF 59%. Left atrial cavity is severely dilated. Right atrial cavity is moderately dilated. Mild (Grade I) aortic regurgitation. Moderate (Grade II) mitral regurgitation. Moderate tricuspid regurgitation. Estimated pulmonary artery systolic pressure 27  mmHg. The aortic root is dilated, ascending aorta measuring 4.3 cm. Compared to previous study in 2016, biatrial enlargement and valvular regurgitation increased in severity.  EKG 07/09/2019: Atrial fibrillation with ventricular response at the rate of 72 bpm, normal axis. LVH. No evidence of ischemia.  Normal QT  No significant change from  EKG 01/01/2019   Assessment     ICD-10-CM   1. Permanent atrial fibrillation (HCC)  I48.21 EKG 12-Lead     No orders of the defined types were placed in this encounter.   Medications Discontinued During This Encounter  Medication Reason   . alfuzosin (UROXATRAL) 10 MG 24 hr tablet Discontinued by provider    Recommendations:   Adrian Daniels  is a 85 y.o. AAM patient with  history of chronic atrial fibrillation, hypertension, hyperlipidemia. Patient is on Eliquis and tolerating this without bleeding complications.  His past medical history significant for stage III chronic kidney disease, essential hypertension, history of TIA and stroke in 2007 and history of colonic polyps and hyperlipidemia.  Patient is presently doing well, he has not had any bleeding diathesis on Eliquis.  I reviewed his external labs, serum creatinine is remained stable, blood counts are stable, lipids are also well controlled.  No clinical evidence of heart failure.  Heart rate is well controlled and blood pressure is also well controlled.  I will see him back on an annual basis.  Adrian Prows, MD, [facility] 07/09/2019, 9:48 AM [facility]. [place] Office: [phone]

## 2019-07-12 ENCOUNTER — Ambulatory Visit: Payer: Medicare Other | Attending: Internal Medicine

## 2019-07-12 DIAGNOSIS — Z23 Encounter for immunization: Secondary | ICD-10-CM | POA: Insufficient documentation

## 2019-07-12 NOTE — Progress Notes (Signed)
   Covid-19 Vaccination Clinic  Name:  Adrian Daniels    MRN: [id number] DOB: Dec 12, 1934  07/12/2019  [name] was observed post Covid-19 immunization for 15 minutes without incidence. He was provided with Vaccine Information Sheet and instruction to access the V-Safe system.   [name] was instructed to call [phone] with any severe reactions post vaccine: Marland Kitchen Difficulty breathing  . Swelling of your face and throat  . A fast heartbeat  . A bad rash all over your body  . Dizziness and weakness    Immunizations Administered    Name Date Dose VIS Date Route   Moderna COVID-19 Vaccine 07/12/2019 11:25 AM 0.5 mL 04/15/2019 Intramuscular   Manufacturer: Moderna   Lot: [id number]   [place] [id number]

## 2019-07-14 DIAGNOSIS — R634 Abnormal weight loss: Secondary | ICD-10-CM | POA: Diagnosis not present

## 2020-03-23 DIAGNOSIS — Z23 Encounter for immunization: Secondary | ICD-10-CM | POA: Diagnosis not present

## 2020-05-04 DIAGNOSIS — I10 Essential (primary) hypertension: Secondary | ICD-10-CM | POA: Diagnosis not present

## 2020-05-06 DIAGNOSIS — I10 Essential (primary) hypertension: Secondary | ICD-10-CM | POA: Diagnosis not present

## 2020-05-06 DIAGNOSIS — Z0001 Encounter for general adult medical examination with abnormal findings: Secondary | ICD-10-CM | POA: Diagnosis not present

## 2020-05-06 DIAGNOSIS — D696 Thrombocytopenia, unspecified: Secondary | ICD-10-CM | POA: Diagnosis not present

## 2020-05-06 DIAGNOSIS — E7800 Pure hypercholesterolemia, unspecified: Secondary | ICD-10-CM | POA: Diagnosis not present

## 2020-05-06 DIAGNOSIS — D72819 Decreased white blood cell count, unspecified: Secondary | ICD-10-CM | POA: Diagnosis not present

## 2020-05-06 DIAGNOSIS — N1831 Chronic kidney disease, stage 3a: Secondary | ICD-10-CM | POA: Diagnosis not present

## 2020-05-06 DIAGNOSIS — R413 Other amnesia: Secondary | ICD-10-CM | POA: Diagnosis not present

## 2020-05-06 DIAGNOSIS — N401 Enlarged prostate with lower urinary tract symptoms: Secondary | ICD-10-CM | POA: Diagnosis not present

## 2020-05-06 DIAGNOSIS — D6869 Other thrombophilia: Secondary | ICD-10-CM | POA: Diagnosis not present

## 2020-05-06 DIAGNOSIS — S61412A Laceration without foreign body of left hand, initial encounter: Secondary | ICD-10-CM | POA: Diagnosis not present

## 2020-05-06 DIAGNOSIS — I482 Chronic atrial fibrillation, unspecified: Secondary | ICD-10-CM | POA: Diagnosis not present

## 2020-06-08 DIAGNOSIS — I482 Chronic atrial fibrillation, unspecified: Secondary | ICD-10-CM | POA: Diagnosis not present

## 2020-06-08 DIAGNOSIS — E7800 Pure hypercholesterolemia, unspecified: Secondary | ICD-10-CM | POA: Diagnosis not present

## 2020-06-08 DIAGNOSIS — I10 Essential (primary) hypertension: Secondary | ICD-10-CM | POA: Diagnosis not present

## 2020-06-08 DIAGNOSIS — Z8673 Personal history of transient ischemic attack (TIA), and cerebral infarction without residual deficits: Secondary | ICD-10-CM | POA: Diagnosis not present

## 2020-06-08 DIAGNOSIS — N401 Enlarged prostate with lower urinary tract symptoms: Secondary | ICD-10-CM | POA: Diagnosis not present

## 2020-06-08 DIAGNOSIS — Z7901 Long term (current) use of anticoagulants: Secondary | ICD-10-CM | POA: Diagnosis not present

## 2020-07-08 ENCOUNTER — Ambulatory Visit: Payer: Medicare Other | Admitting: Cardiology

## 2020-07-16 ENCOUNTER — Ambulatory Visit: Payer: Medicare Other | Admitting: Cardiology

## 2020-07-16 ENCOUNTER — Other Ambulatory Visit: Payer: Self-pay

## 2020-07-16 ENCOUNTER — Encounter: Payer: Self-pay | Admitting: Cardiology

## 2020-07-16 VITALS — BP 130/77 | HR 65 | Temp 97.7°F | Resp 17 | Ht 71.0 in | Wt 175.0 lb

## 2020-07-16 DIAGNOSIS — N1831 Chronic kidney disease, stage 3a: Secondary | ICD-10-CM

## 2020-07-16 DIAGNOSIS — I4821 Permanent atrial fibrillation: Secondary | ICD-10-CM | POA: Diagnosis not present

## 2020-07-16 DIAGNOSIS — D693 Immune thrombocytopenic purpura: Secondary | ICD-10-CM | POA: Diagnosis not present

## 2020-07-16 DIAGNOSIS — I77810 Thoracic aortic ectasia: Secondary | ICD-10-CM | POA: Diagnosis not present

## 2020-07-16 DIAGNOSIS — I10 Essential (primary) hypertension: Secondary | ICD-10-CM | POA: Diagnosis not present

## 2020-07-16 NOTE — Progress Notes (Signed)
Primary Physician/Referring:  [name], MD  Patient ID: Adrian Daniels, male    DOB: 02-Apr-1935, 86 y.o.   MRN: [id number]  Chief Complaint  Patient presents with  . Follow-up  . Atrial Fibrillation   HPI:    Adrian Daniels  is a 86 y.o. AAM patient with  history of chronic atrial fibrillation, hypertension, hyperlipidemia. Patient is on Eliquis and tolerating this without bleeding complications.  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   He is presently doing well and has not had a bleeding diathesis Eliquis.  No chest pain, palpitations, dizziness or syncope.  No GI bleeding.  Past Medical History:  Diagnosis Date  . Anemia, unspecified   . Atrial fibrillation ([place])   . Atypical chest pain 06/30/2018  . CKD (chronic kidney disease) stage 3, GFR 30-59 ml/min ([place]) 06/30/2018  . Dysrhythmia   . Essential hypertension 06/30/2018  . H/O TIA (transient ischemic attack) and stroke 06/30/2005  . Hemorrhoids   . History of colon polyps 05-28-2007   Adenomatous polyp(Colonoscopy with Dr. Lajoyce Corners)  . Hx of adenomatous colonic polyps 02/24/2003   2004,2006,2009 Dr. Lajoyce Corners  . Hyperlipemia   . Hypertension   . Internal hemorrhoids, bleeding and Grade 3 prolapse 12/09/2014  . Wears dentures    Past Surgical History:  Procedure Laterality Date  . CARDIOVERSION N/A 03/02/2015   Procedure: CARDIOVERSION;  Surgeon: Adrian Prows, MD;  Location: [facility];  Service: Cardiovascular;  Laterality: N/A;  . COLONOSCOPY  2009  . HEMORRHOID BANDING  2016  . INGUINAL HERNIA REPAIR Left 08/10/2015   Procedure: LEFT INGUINAL HERNIA REPAIR WITH MESH;  Surgeon: Donnie Mesa, MD;  Location: [facility];  Service: General;  Laterality: Left;  . INSERTION OF MESH Left 08/10/2015   Procedure: INSERTION OF MESH;  Surgeon: Donnie Mesa,  MD;  Location: [facility];  Service: General;  Laterality: Left;  Marland Kitchen MULTIPLE TOOTH EXTRACTIONS     Family History  Problem Relation Age of Onset  . Diabetes Daughter   . Cervical cancer Sister   . Colon cancer Neg Hx   . Colon polyps Neg Hx     Social History   Tobacco Use  . Smoking status: Former Smoker    Packs/day: 0.50    Years: 30.00    Pack years: 15.00    Types: Cigarettes    Quit date: 07/08/1989    Years since quitting: 31.0  . Smokeless tobacco: Never Used  Substance Use Topics  . Alcohol use: Yes    Alcohol/week: 4.0 standard drinks    Types: 4 Standard drinks or equivalent per week    Comment: 4 per week   ROS  Review of Systems  Cardiovascular: Positive for syncope. Negative for chest pain, dyspnea on exertion and leg swelling.  Gastrointestinal: Negative for hematochezia and melena.  Neurological: Negative for dizziness.   Objective  Blood pressure 130/77, pulse 65, temperature 97.7 F (36.5 C), temperature source Temporal, resp. rate 17, height _0  (1.803 m), weight 175 lb (79.4 kg), SpO2 98 %.  Vitals with BMI 07/16/2020 07/09/2019 01/01/2019  Height _1  _2  _3   Weight 175 lbs 171 lbs 173 lbs  BMI 24.42 42.70 62.37  Systolic 628 315 176  Diastolic 77 83 82  Pulse 65 71 70     Physical Exam HENT:  Head: Atraumatic.  Cardiovascular:     Rate and Rhythm: Normal rate. Rhythm irregular.     Pulses:          Carotid pulses are 2+ on the right side and 2+ on the left side.      Dorsalis pedis pulses are 2+ on the right side and 2+ on the left side.       Posterior tibial pulses are 2+ on the right side and 2+ on the left side.     Heart sounds: Murmur heard.  High-pitched blowing decrescendo early diastolic murmur is present with a grade of 2/4 at the upper right sternal border and lower left sternal border radiating to the apex. No gallop. No S3 or S4 sounds.      Comments: S1 is variable, S2 is normal. No JVD, No leg edema. Pulmonary:      Effort: Pulmonary effort is normal.     Breath sounds: Normal breath sounds.  Abdominal:     General: Bowel sounds are normal.     Palpations: Abdomen is soft.    Laboratory examination:   No results for input(s): NA, K, CL, CO2, GLUCOSE, BUN, CREATININE, CALCIUM, GFRNONAA, GFRAA in the last 8760 hours. CrCl cannot be calculated (Patient's Daniels recent lab result is older than the maximum 21 days allowed.).  CMP Latest Ref Rng & Units 07/21/2018 07/20/2018 05/22/2018  Glucose 70 - 99 mg/dL 77 126(H) 103(H)  BUN 8 - 23 mg/dL 21 25(H) 18  Creatinine 0.61 - 1.24 mg/dL 1.57(H) 1.92(H) 1.43(H)  Sodium 135 - 145 mmol/L 140 139 142  Potassium 3.5 - 5.1 mmol/L 3.5 3.2(L) 3.7  Chloride 98 - 111 mmol/L 107 104 107  CO2 22 - 32 mmol/L _0 Calcium 8.9 - 10.3 mg/dL 8.4(L) 8.5(L) 9.0   CBC Latest Ref Rng & Units 07/21/2018 07/20/2018 05/22/2018  WBC 4.0 - 10.5 K/uL 4.7 4.0 3.2(L)  Hemoglobin 13.0 - 17.0 g/dL 10.1(L) 10.8(L) 11.4(L)  Hematocrit 39.0 - 52.0 % 29.8(L) 32.7(L) 34.2(L)  Platelets 150 - 400 K/uL 124(L) 122(L) 127(L)    External labs :   Labs 05/04/2020:  Hb 11.8/HCT 34.8, platelets 117.  Normal indicis.  Sodium 148, potassium 4.1, BUN 20, creatinine 1.4, EGFR 62 mL.  Total cholesterol 172, triglycerides 33, HDL 73, LDL 92.  labs 04/30/2019:   Hb 12.2/HCT 34.3, platelets [birth year]1935. BUN 17, creatinine 1.4, EGFR 51 mL.  CMP normal. Total cholesterol 160, triglycerides 42, HDL 70, LDL 82.  Non-HDL cholesterol 90.  Labs 05/22/2018: Serum glucose 13 mg, potassium 3.7, BUN 18, creatinine 1.43, eGFR 53 mL.  HB 11.4/HCT 34.2, platelets 127.  Normal indicis.  10/18/2016: Creatinine 1.5, EGFR 50, potassium 3.9, BMP otherwise normal.  Hemoglobin 12.5, hematocrit 35.0, CBC otherwise normal.  Medications and allergies  No Known Allergies   Current Outpatient Medications on File Prior to Visit  Medication Sig Dispense Refill  . apixaban (ELIQUIS) 2.5 MG TABS tablet Take 1 tablet by mouth 2  (two) times daily.    Marland Kitchen diltiazem (CARDIZEM CD) 300 MG 24 hr capsule Take 300 mg by mouth daily.    Marland Kitchen losartan-hydrochlorothiazide (HYZAAR) 100-12.5 MG tablet Take 1 tablet by mouth daily.    . rosuvastatin (CRESTOR) 20 MG tablet Take 1 tablet by mouth daily.    . tamsulosin (FLOMAX) 0.4 MG CAPS capsule Take 0.4 mg by mouth daily.     No current facility-administered medications on file prior to visit.    Radiology:   CXR 07/20/2018:  Mild left basilar atelectasis. Mild cardiomegaly without pulmonary edema.  Cardiac Studies:   Lower extremity arterial duplex 01/27/2015: No hemodynamically significant stenoses are identified in the bilateral lower extremity arterial system.This exam reveals normal perfusion of both the lower extremities with RABI 1.03 and LABI 0.96.  Exercise myoview stress 06/05/2018:  1. The patient performed treadmill exercise using Bruce protocol, completing 5:07 minutes. The patient completed an estimated workload of 7 METS, reaching 92% of the maximum predicted heart rate. Exercise capacity was low.. Resting hypertension 142/106 mmHg, with exaggerated exercise response, peak BP reaching 220/80 mmHg. No stress symptoms reported. No ischemic changes seen on stress electrocardiogram.  2. The overall quality of the study is good. There is no evidence of abnormal lung activity. Stress and rest SPECT images demonstrate homogeneous tracer distribution throughout the myocardium. Gated SPECT imaging reveals normal myocardial thickening and wall motion. The left ventricular ejection fraction was calculated 48%, although visually appears normal.  3. Low risk study.  Echocardiogram 06/10/2018: Left ventricle cavity is normal in size. Mild concentric hypertrophy of the left ventricle. Normal global wall motion. Unable to evaluate diastolic function due to A. Fibrillation. Calculated EF 59%. Left atrial cavity is severely dilated. Right atrial cavity is moderately dilated. Mild  (Grade I) aortic regurgitation. Moderate (Grade II) mitral regurgitation. Moderate tricuspid regurgitation. Estimated pulmonary artery systolic pressure 27  mmHg. The aortic root is dilated, ascending aorta measuring 4.3 cm. Compared to previous study in 2016, biatrial enlargement and valvular regurgitation increased in severity.    EKG 07/17/2020: Atrial fibrillation with controlled ventricular response at the rate of 59 bpm, normal axis, no evidence of ischemia.  No significant change from EKG 07/09/2019, EKG 01/01/2019   Assessment     ICD-10-CM   1. Permanent atrial fibrillation (HCC) CHA2DS2-VASc Score is 6. Yearly risk of stroke 9.8% (A, HTN, TIA, Vasc Dz)  I48.21 EKG 12-Lead    PCV ECHOCARDIOGRAM COMPLETE  2. Essential hypertension  I10   3. Stage 3a chronic kidney disease (HCC)  N18.31   4. Aortic root dilatation (HCC)  I77.810 PCV ECHOCARDIOGRAM COMPLETE  5. Chronic idiopathic thrombocytopenia (HCC)  D69.3     No orders of the defined types were placed in this encounter.   Medications Discontinued During This Encounter  Medication Reason  . apixaban (ELIQUIS) 5 MG TABS tablet Error  . pravastatin (PRAVACHOL) 80 MG tablet Error    Recommendations:   ZEBEDEE SEGUNDO  is a 86 y.o.AAM patient with  history of chronic atrial fibrillation, hypertension, hyperlipidemia. Patient is on Eliquis and tolerating this without bleeding complications.  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   Patient is presently doing well, he has not had any bleeding diathesis on Eliquis.  I reviewed his external labs, serum creatinine is remained stable, blood counts are stable, has chronic thrombocytopenia lipids are also well controlled.  No clinical evidence of heart failure.  Heart rate is well controlled and blood  pressure is also well controlled.  The aortic regurgitation murmur appears slightly more prominent which had not previously heard.  He also has history of aortic root dilatation.  I will repeat an echocardiogram and unless abnormal I will see him back on an annual basis.    Adrian Prows, MD, [facility] 07/16/2020, 2:05 PM Office: [phone] Pager: [phone]

## 2020-07-29 ENCOUNTER — Ambulatory Visit: Payer: Medicare Other

## 2020-07-29 ENCOUNTER — Other Ambulatory Visit: Payer: Self-pay

## 2020-07-29 DIAGNOSIS — I77810 Thoracic aortic ectasia: Secondary | ICD-10-CM

## 2020-07-29 DIAGNOSIS — I4821 Permanent atrial fibrillation: Secondary | ICD-10-CM | POA: Diagnosis not present

## 2020-08-02 NOTE — Progress Notes (Signed)
2nd attempt : Called patient, NA, LMAM

## 2020-08-02 NOTE — Progress Notes (Signed)
Called patient, NA, LMAM

## 2020-08-02 NOTE — Progress Notes (Signed)
Patient called back, I have discussed echocardiogram results with him.

## 2020-08-04 DIAGNOSIS — K4090 Unilateral inguinal hernia, without obstruction or gangrene, not specified as recurrent: Secondary | ICD-10-CM | POA: Diagnosis not present

## 2020-08-04 DIAGNOSIS — R351 Nocturia: Secondary | ICD-10-CM | POA: Diagnosis not present

## 2020-08-04 DIAGNOSIS — N401 Enlarged prostate with lower urinary tract symptoms: Secondary | ICD-10-CM | POA: Diagnosis not present

## 2020-08-04 DIAGNOSIS — N5201 Erectile dysfunction due to arterial insufficiency: Secondary | ICD-10-CM | POA: Diagnosis not present

## 2020-08-06 DIAGNOSIS — N401 Enlarged prostate with lower urinary tract symptoms: Secondary | ICD-10-CM | POA: Diagnosis not present

## 2020-08-06 DIAGNOSIS — Z7901 Long term (current) use of anticoagulants: Secondary | ICD-10-CM | POA: Diagnosis not present

## 2020-08-06 DIAGNOSIS — I10 Essential (primary) hypertension: Secondary | ICD-10-CM | POA: Diagnosis not present

## 2020-08-06 DIAGNOSIS — E7800 Pure hypercholesterolemia, unspecified: Secondary | ICD-10-CM | POA: Diagnosis not present

## 2020-08-11 DIAGNOSIS — I482 Chronic atrial fibrillation, unspecified: Secondary | ICD-10-CM | POA: Diagnosis not present

## 2020-08-11 DIAGNOSIS — N401 Enlarged prostate with lower urinary tract symptoms: Secondary | ICD-10-CM | POA: Diagnosis not present

## 2020-08-11 DIAGNOSIS — Z7901 Long term (current) use of anticoagulants: Secondary | ICD-10-CM | POA: Diagnosis not present

## 2020-08-11 DIAGNOSIS — Z8673 Personal history of transient ischemic attack (TIA), and cerebral infarction without residual deficits: Secondary | ICD-10-CM | POA: Diagnosis not present

## 2020-08-11 DIAGNOSIS — R972 Elevated prostate specific antigen [PSA]: Secondary | ICD-10-CM | POA: Diagnosis not present

## 2020-08-11 DIAGNOSIS — E7800 Pure hypercholesterolemia, unspecified: Secondary | ICD-10-CM | POA: Diagnosis not present

## 2020-08-11 DIAGNOSIS — I10 Essential (primary) hypertension: Secondary | ICD-10-CM | POA: Diagnosis not present

## 2020-09-13 DIAGNOSIS — K4090 Unilateral inguinal hernia, without obstruction or gangrene, not specified as recurrent: Secondary | ICD-10-CM | POA: Diagnosis not present

## 2021-01-28 ENCOUNTER — Ambulatory Visit: Payer: Self-pay | Admitting: General Surgery

## 2021-01-28 DIAGNOSIS — K4091 Unilateral inguinal hernia, without obstruction or gangrene, recurrent: Secondary | ICD-10-CM | POA: Diagnosis not present

## 2021-01-28 NOTE — H&P (Signed)
Subjective   Chief Complaint: post opp       History of Present Illness: Patient is an 86 year old male who follows back up today secondary to a right inguinal hernia.  Patient was here in May 2022. Patient states that the hernia is gotten larger and causing more pain.  He states he has more pain secondary to standing and walking.  He states that the hernia does flatten out when he lays down.   Patient's had no signs or symptoms of incarceration or strangulation.   Patient's had no previous abdominal surgery.   Patient is currently on Eliquis.  Dr. Einar Gip is his cardiologist.     Review of Systems: A complete review of systems was obtained from the patient.  I have reviewed this information and discussed as appropriate with the patient.  See HPI as well for other ROS.   Review of Systems  Constitutional: Negative for fever.  HENT: Negative for congestion.   Eyes: Negative for blurred vision.  Respiratory: Negative for cough, shortness of breath and wheezing.   Cardiovascular: Negative for chest pain and palpitations.  Gastrointestinal: Negative for heartburn.  Genitourinary: Negative for dysuria.  Musculoskeletal: Negative for myalgias.  Skin: Negative for rash.  Neurological: Negative for dizziness and headaches.  Psychiatric/Behavioral: Negative for depression and suicidal ideas.  All other systems reviewed and are negative.       Medical History: Past Medical History Past Medical History: Diagnosis Date  History of stroke    Hyperlipidemia    Hypertension        There is no problem list on file for this patient.     Past Surgical History Past Surgical History: Procedure Laterality Date  hernia surgery          Allergies No Known Allergies    Current Outpatient Medications on File Prior to Visit Medication Sig Dispense Refill  ezetimibe (ZETIA) 10 mg tablet 1 tablet      rosuvastatin (CRESTOR) 20 MG tablet 1 tablet      tamsulosin (FLOMAX) 0.4 mg  capsule 1 capsule 30 minutes after the same meal each day      apixaban (ELIQUIS) 2.5 mg tablet Take 1 tablet by mouth 2 (two) times daily      desloratadine (CLARINEX) 5 mg tablet 1 tablet      diltiazem (CARTIA XT) 300 MG CD capsule Take 1 capsule by mouth once daily      losartan-hydrochlorothiazide (HYZAAR) 100-12.5 mg tablet Take 1 tablet by mouth once daily       No current facility-administered medications on file prior to visit.     Family History History reviewed. No pertinent family history.     Social History   Tobacco Use Smoking Status Never Smoker Smokeless Tobacco Never Used     Social History Social History    Socioeconomic History  Marital status: Single Tobacco Use  Smoking status: Never Smoker  Smokeless tobacco: Never Used [employer]or, mining Use: Never used Substance and Sexual Activity  Alcohol use: Never  Drug use: Never      Objective:     There were no vitals filed for this visit.  There is no height or weight on file to calculate BMI. Physical Exam Constitutional:      Appearance: Normal appearance.  HENT:     Head: Normocephalic and atraumatic.     Nose: Nose normal. No congestion.     Mouth/Throat:     Mouth: Mucous membranes are moist.  Pharynx: Oropharynx is clear.  Eyes:     Pupils: Pupils are equal, round, and reactive to light.  Cardiovascular:     Rate and Rhythm: Normal rate and regular rhythm.     Pulses: Normal pulses.     Heart sounds: Normal heart sounds. No murmur heard.   No friction rub. No gallop.  Pulmonary:     Effort: Pulmonary effort is normal. No respiratory distress.     Breath sounds: Normal breath sounds. No stridor. No wheezing, rhonchi or rales.  Abdominal:     General: Abdomen is flat.     Hernia: A hernia is present. Hernia is present in the right inguinal area.  Musculoskeletal:        General: Normal range of motion.     Cervical back: Normal range of motion.  Skin:    General: Skin is warm  and dry.  Neurological:     General: No focal deficit present.     Mental Status: He is alert and oriented to person, place, and time.  Psychiatric:        Mood and Affect: Mood normal.        Thought Content: Thought content normal.          Assessment and Plan: Diagnoses and all orders for this visit:   Unilateral recurrent inguinal hernia without obstruction or gangrene     DAUNDRE HUNTSBERGER is a 86 y.o. male    1.  We will proceed to the OR for a lap right inguinal hernia repair with mesh. 2. All risks and benefits were discussed with the patient, to generally include infection, bleeding, damage to surrounding structures, acute and chronic nerve pain, and recurrence. Alternatives were offered and described.  All questions were answered and the patient voiced understanding of the procedure and wishes to proceed at this point.             No follow-ups on file.   Ralene Ok, MD, [facility], [facility]

## 2021-02-02 ENCOUNTER — Encounter: Payer: Self-pay | Admitting: Cardiology

## 2021-02-08 DIAGNOSIS — R634 Abnormal weight loss: Secondary | ICD-10-CM | POA: Diagnosis not present

## 2021-02-08 DIAGNOSIS — E7800 Pure hypercholesterolemia, unspecified: Secondary | ICD-10-CM | POA: Diagnosis not present

## 2021-02-08 DIAGNOSIS — I10 Essential (primary) hypertension: Secondary | ICD-10-CM | POA: Diagnosis not present

## 2021-02-10 DIAGNOSIS — R634 Abnormal weight loss: Secondary | ICD-10-CM | POA: Diagnosis not present

## 2021-02-10 DIAGNOSIS — Z8673 Personal history of transient ischemic attack (TIA), and cerebral infarction without residual deficits: Secondary | ICD-10-CM | POA: Diagnosis not present

## 2021-02-10 DIAGNOSIS — Z7901 Long term (current) use of anticoagulants: Secondary | ICD-10-CM | POA: Diagnosis not present

## 2021-02-10 DIAGNOSIS — N401 Enlarged prostate with lower urinary tract symptoms: Secondary | ICD-10-CM | POA: Diagnosis not present

## 2021-02-10 DIAGNOSIS — M545 Low back pain, unspecified: Secondary | ICD-10-CM | POA: Diagnosis not present

## 2021-02-10 DIAGNOSIS — R972 Elevated prostate specific antigen [PSA]: Secondary | ICD-10-CM | POA: Diagnosis not present

## 2021-02-10 DIAGNOSIS — I10 Essential (primary) hypertension: Secondary | ICD-10-CM | POA: Diagnosis not present

## 2021-02-10 DIAGNOSIS — E7800 Pure hypercholesterolemia, unspecified: Secondary | ICD-10-CM | POA: Diagnosis not present

## 2021-02-10 DIAGNOSIS — I482 Chronic atrial fibrillation, unspecified: Secondary | ICD-10-CM | POA: Diagnosis not present

## 2021-02-28 DIAGNOSIS — H25813 Combined forms of age-related cataract, bilateral: Secondary | ICD-10-CM | POA: Diagnosis not present

## 2021-02-28 DIAGNOSIS — H524 Presbyopia: Secondary | ICD-10-CM | POA: Diagnosis not present

## 2021-02-28 DIAGNOSIS — H35033 Hypertensive retinopathy, bilateral: Secondary | ICD-10-CM | POA: Diagnosis not present

## 2021-02-28 DIAGNOSIS — I10 Essential (primary) hypertension: Secondary | ICD-10-CM | POA: Diagnosis not present

## 2021-03-08 NOTE — Pre-Procedure Instructions (Signed)
Surgical Instructions    Your procedure is scheduled on Wednesday 03/16/21.   Report to [facility] Main Entrance "A" at 11:15 A.M., then check in with the Admitting office.  Call this number if you have problems the morning of surgery:  [phone]   If you have any questions prior to your surgery date call [phone]: Open Monday-Friday 8am-4pm    Remember:  Do not eat after midnight the night before your surgery  You may drink clear liquids until 10:15 A.M. the morning of your surgery.   Clear liquids allowed are: Water, Non-Citrus Juices (without pulp), Carbonated Beverages, Clear Tea, Black Coffee Only, and Gatorade  Patient Instructions  The night before surgery:  No food after midnight. ONLY clear liquids after midnight  The day of surgery (if you do NOT have diabetes):  Drink ONE (1) Pre-Surgery Clear Ensure by 10:15 A.M. the morning of surgery. Drink in one sitting. Do not sip.  This drink was given to you during your hospital  pre-op appointment visit.  Nothing else to drink after completing the  Pre-Surgery Clear Ensure.         If you have questions, please contact your surgeon's office.     Take these medicines the morning of surgery with A SIP OF WATER  diltiazem (CARDIZEM CD)   ezetimibe (ZETIA)  rosuvastatin (CRESTOR)   bisacodyl (DULCOLAX)- If needed   Tetrahydrozoline HCl (VISINE OP)- If needed  Please follow your surgeon's instructions regarding apixaban (ELIQUIS). If you have not received instructions then you need to contact your surgeon's office for instructions.   As of today, STOP taking any Aspirin (unless otherwise instructed by your surgeon) Aleve, Naproxen, Ibuprofen, Motrin, Advil, Goody's, BC's, all herbal medications, fish oil, and all vitamins.                     Do NOT Smoke (Tobacco/Vaping) or drink Alcohol 24 hours prior to your procedure.  If you use a CPAP at night, you may bring all equipment for your overnight stay.    Contacts, glasses, piercing's, hearing aid's, dentures or partials may not be worn into surgery, please bring cases for these belongings.    For patients admitted to the hospital, discharge time will be determined by your treatment team.   Patients discharged the day of surgery will not be allowed to drive home, and someone needs to stay with them for 24 hours.  NO VISITORS WILL BE ALLOWED IN PRE-OP WHERE PATIENTS GET READY FOR SURGERY.  ONLY 1 SUPPORT PERSON MAY BE PRESENT IN THE WAITING ROOM WHILE YOU ARE IN SURGERY.  IF YOU ARE TO BE ADMITTED, ONCE YOU ARE IN YOUR ROOM YOU WILL BE ALLOWED TWO (2) VISITORS.  Minor children may have two parents present. Special consideration for safety and communication needs will be reviewed on a case by case basis.   Special instructions:   [place]- Preparing For Surgery  Before surgery, you can play an important role. Because skin is not sterile, your skin needs to be as free of germs as possible. You can reduce the number of germs on your skin by washing with CHG (chlorahexidine gluconate) Soap before surgery.  CHG is an antiseptic cleaner which kills germs and bonds with the skin to continue killing germs even after washing.    Oral Hygiene is also important to reduce your risk of infection.  Remember - BRUSH YOUR TEETH THE MORNING OF SURGERY WITH YOUR REGULAR TOOTHPASTE  Please do not use  if you have an allergy to CHG or antibacterial soaps. If your skin becomes reddened/irritated stop using the CHG.  Do not shave (including legs and underarms) for at least 48 hours prior to first CHG shower. It is OK to shave your face.  Please follow these instructions carefully.   Shower the NIGHT BEFORE SURGERY and the MORNING OF SURGERY  If you chose to wash your hair, wash your hair first as usual with your normal shampoo.  After you shampoo, rinse your hair and body thoroughly to remove the shampoo.  Use CHG Soap as you would any other liquid soap. You  can apply CHG directly to the skin and wash gently with a scrungie or a clean washcloth.   Apply the CHG Soap to your body ONLY FROM THE NECK DOWN.  Do not use on open wounds or open sores. Avoid contact with your eyes, ears, mouth and genitals (private parts). Wash Face and genitals (private parts)  with your normal soap.   Wash thoroughly, paying special attention to the area where your surgery will be performed.  Thoroughly rinse your body with warm water from the neck down.  DO NOT shower/wash with your normal soap after using and rinsing off the CHG Soap.  Pat yourself dry with a CLEAN TOWEL.  Wear CLEAN PAJAMAS to bed the night before surgery  Place CLEAN SHEETS on your bed the night before your surgery  DO NOT SLEEP WITH PETS.   Day of Surgery: Shower with CHG soap. Do not wear jewelry, make up, nail polish, gel polish, artificial nails, or any other type of covering on natural nails including finger and toenails. If patients have artificial nails, gel coating, etc. that need to be removed by a nail salon please have this removed prior to surgery. Surgery may need to be canceled/delayed if the surgeon/ anesthesia feels like the patient is unable to be adequately monitored. Do not wear lotions, powders, perfumes/colognes, or deodorant. Do not shave 48 hours prior to surgery.  Men may shave face and neck. Do not bring valuables to the hospital. [facility] is not responsible for any belongings or valuables. Wear Clean/Comfortable clothing the morning of surgery Remember to brush your teeth WITH YOUR REGULAR TOOTHPASTE.   Please read over the following fact sheets that you were given.   3 days prior to your procedure or After your COVID test   You are not required to quarantine however you are required to wear a well-fitting mask when you are out and around people not in your household. If your mask becomes wet or soiled, replace with a new one.   Wash your hands often with  soap and water for 20 seconds or clean your hands with an alcohol-based hand sanitizer that contains at least 60% alcohol.   Do not share personal items.   Notify your provider:  o if you are in close contact with someone who has COVID  o or if you develop a fever of 100.4 or greater, sneezing, cough, sore throat, shortness of breath or body aches.

## 2021-03-09 ENCOUNTER — Encounter (HOSPITAL_COMMUNITY): Payer: Self-pay

## 2021-03-09 ENCOUNTER — Encounter (HOSPITAL_COMMUNITY)
Admission: RE | Admit: 2021-03-09 | Discharge: 2021-03-09 | Disposition: A | Discharge status: Home / Self Care | Payer: Medicare Other | Source: Ambulatory Visit | Attending: General Surgery | Admitting: General Surgery

## 2021-03-09 ENCOUNTER — Other Ambulatory Visit: Payer: Self-pay

## 2021-03-09 VITALS — BP 140/88 | HR 75 | Temp 97.8°F | Resp 18 | Ht 71.0 in | Wt 165.0 lb

## 2021-03-09 DIAGNOSIS — Z01812 Encounter for preprocedural laboratory examination: Secondary | ICD-10-CM | POA: Diagnosis not present

## 2021-03-09 DIAGNOSIS — I129 Hypertensive chronic kidney disease with stage 1 through stage 4 chronic kidney disease, or unspecified chronic kidney disease: Secondary | ICD-10-CM | POA: Insufficient documentation

## 2021-03-09 DIAGNOSIS — Z79899 Other long term (current) drug therapy: Secondary | ICD-10-CM | POA: Insufficient documentation

## 2021-03-09 DIAGNOSIS — N183 Chronic kidney disease, stage 3 unspecified: Secondary | ICD-10-CM | POA: Diagnosis not present

## 2021-03-09 DIAGNOSIS — Z7901 Long term (current) use of anticoagulants: Secondary | ICD-10-CM | POA: Insufficient documentation

## 2021-03-09 DIAGNOSIS — Z86010 Personal history of colonic polyps: Secondary | ICD-10-CM | POA: Insufficient documentation

## 2021-03-09 DIAGNOSIS — Z8673 Personal history of transient ischemic attack (TIA), and cerebral infarction without residual deficits: Secondary | ICD-10-CM | POA: Insufficient documentation

## 2021-03-09 DIAGNOSIS — E785 Hyperlipidemia, unspecified: Secondary | ICD-10-CM | POA: Insufficient documentation

## 2021-03-09 DIAGNOSIS — K4090 Unilateral inguinal hernia, without obstruction or gangrene, not specified as recurrent: Secondary | ICD-10-CM | POA: Diagnosis not present

## 2021-03-09 DIAGNOSIS — D631 Anemia in chronic kidney disease: Secondary | ICD-10-CM | POA: Diagnosis not present

## 2021-03-09 DIAGNOSIS — I4891 Unspecified atrial fibrillation: Secondary | ICD-10-CM | POA: Insufficient documentation

## 2021-03-09 DIAGNOSIS — Z01818 Encounter for other preprocedural examination: Secondary | ICD-10-CM

## 2021-03-09 HISTORY — DX: Nonrheumatic aortic (valve) insufficiency: I35.1

## 2021-03-09 HISTORY — DX: Thoracic aortic ectasia: I77.810

## 2021-03-09 LAB — BASIC METABOLIC PANEL
Anion gap: 5 (ref 5–15)
BUN: 17 mg/dL (ref 8–23)
CO2: 28 mmol/L (ref 22–32)
Calcium: 8.7 mg/dL — ABNORMAL LOW (ref 8.9–10.3)
Chloride: 107 mmol/L (ref 98–111)
Creatinine, Ser: 1.63 mg/dL — ABNORMAL HIGH (ref 0.61–1.24)
GFR, Estimated: 41 mL/min — ABNORMAL LOW (ref >60)
Glucose, Bld: 89 mg/dL (ref 70–99)
Potassium: 3.4 mmol/L — ABNORMAL LOW (ref 3.5–5.1)
Sodium: 140 mmol/L (ref 135–145)

## 2021-03-09 LAB — CBC
HCT: 32.1 % — ABNORMAL LOW (ref 39.0–52.0)
Hemoglobin: 10.8 g/dL — ABNORMAL LOW (ref 13.0–17.0)
MCH: 29.8 pg (ref 26.0–34.0)
MCHC: 33.6 g/dL (ref 30.0–36.0)
MCV: 88.7 fL (ref 80.0–100.0)
Platelets: 134 10*3/uL — ABNORMAL LOW (ref 150–400)
RBC: 3.62 MIL/uL — ABNORMAL LOW (ref 4.22–5.81)
RDW: 13.2 % (ref 11.5–15.5)
WBC: 3.4 10*3/uL — ABNORMAL LOW (ref 4.0–10.5)
nRBC: 0 % (ref 0.0–0.2)

## 2021-03-09 NOTE — Progress Notes (Signed)
PCP - [name] Cardiologist - Adrian Prows  PPM/ICD - denies   Chest x-ray - n/a EKG - 07/16/20 Stress Test - 08/15/00 ECHO - 07/29/20 Cardiac Cath - denies  Sleep Study - denies   No diabetes    ERAS Protcol -yes PRE-SURGERY Ensure or G2- ensure given  COVID TEST- ambulatory surgery, not needed  As of today, STOP taking any Aspirin (unless otherwise instructed by your surgeon) Aleve, Naproxen, Ibuprofen, Motrin, Advil, Goody's, BC's, all herbal medications, fish oil, and all vitamins.  Hold Eliquis for 2 days prior to procedure.   Anesthesia review: yes, cardiac clearance note  Patient denies shortness of breath, fever, cough and chest pain at PAT appointment   All instructions explained to the patient, with a verbal understanding of the material. Patient agrees to go over the instructions while at home for a better understanding. Patient also instructed to self quarantine after being tested for COVID-19. The opportunity to ask questions was provided.

## 2021-03-10 ENCOUNTER — Encounter (HOSPITAL_COMMUNITY): Payer: Self-pay | Admitting: Vascular Surgery

## 2021-03-10 ENCOUNTER — Encounter (HOSPITAL_COMMUNITY): Payer: Self-pay

## 2021-03-10 ENCOUNTER — Encounter (HOSPITAL_COMMUNITY): Payer: Self-pay | Admitting: Anesthesiology

## 2021-03-10 NOTE — Progress Notes (Signed)
Anesthesia Chart Review:   Case: [id number] Date/Time: 03/16/21 1300   Procedure: LAPAROSCOPIC RIGHT INGUINAL HERNIA REPAIR WITH MESH (Right)   Anesthesia type: General   Pre-op diagnosis: RIGHT INGUINAL HERNIA   Location: [facility] OR ROOM 09 / [place] [facility]   Surgeons: Ralene Ok, MD       DISCUSSION: Patient is an 86 year old male scheduled for the above procedure.  History includes former smoker (quit 07/08/89), HTN, HLD, afib (diagnosed 12/01/14, s/p unsuccessful DCCV 03/02/15), AI (moderate 07/2020 echo), aortic root dilation (sinotubular junction 43 mm, proximal ascending aorta ~ 50 mm 07/2020 echo), atypical chest pain (08/2000, 12020 with nonischemic stress test), TIA (2007), CKD (stage 3), anemia, hernia (left IHR 08/10/15).   Last cardiology visit with Dr. Einar Gip 07/16/20. Echo ordered. One year follow-up planned. He composed a clearance letter (Letters tab) on 02/03/20 stating, "DOYAL SARIC is at low risk, from a cardiac standpoint, for his upcoming procedure: Right inguinal hernia repair.  It is ok to proceed without further cardiac testing.  If applicable can hold Eliquis for 2 day(s) prior to procedure and re-start next or in 1 to 3  days post procedure." Patient instructed to hold Eliquis for 2 days prior to surgery.   Anesthesia team to evaluate on the day of surgery.   VS: BP 140/88   Pulse 75   Temp 36.6 C (Oral)   Resp 18   Ht 5' 11" (1.803 m)   Wt 74.8 kg   SpO2 100%   BMI 23.01 kg/m    PROVIDERS: [name], MD is PCP  Adrian Prows, MD is cardiologist   LABS: Labs reviewed: Acceptable for surgery. Cr 1.63 and eGFR 41 is consistent with labs from 07/21/18 and known history of CKD3.  (all labs ordered are listed, but only abnormal results are displayed)  Labs Reviewed  CBC - Abnormal; Notable for the following components:      Result Value   WBC 3.4 (*)    RBC 3.62 (*)    Hemoglobin 10.8 (*)    HCT 32.1 (*)    Platelets 134 (*)    All other components within normal  limits  BASIC METABOLIC PANEL - Abnormal; Notable for the following components:   Potassium 3.4 (*)    Creatinine, Ser 1.63 (*)    Calcium 8.7 (*)    GFR, Estimated 41 (*)    All other components within normal limits     EKG: EKG 07/17/2020: Atrial fibrillation with controlled ventricular response at the rate of 59 bpm, normal axis, no evidence of ischemia.     CV: Echocardiogram 07/29/2020 [facility] CV):  Normal LV systolic function with visual EF 55-60%. Left ventricle cavity  is normal in size. Mild left ventricular hypertrophy. Normal global wall  motion. Unable to evaluate diastolic function due to atrial fibrillation.  Normal LAP.  Left atrial cavity is mildly dilated.  Aortic valve appears to be bicuspid with fusion of the RCC and LCC.    Eccentric closure on parasternal long axis view.  Moderate aortic  regurgitation with an eccentric jet.  Mild (Grade I) mitral regurgitation.  Mild tricuspid regurgitation.  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  Previous study dated 07/21/2018: LVEF 55-60%, mild LVH, RVSP 46 mmHg,  tricuspid aortic valve, mild AR, mild to moderate dilatation of the aortic  root (sinus of Valsalva 42 mm, proximal ascending aorta 40 mm).  Recommendation: Consider additional cardiovascular testing to  further  evaluate the aortic valve morphology and aorta dimensions. - Reviewed by Dr. Einar Gip, "Aortic root size is stable.  Normal LV systolic function...no significant change from prior evaluation, he has an appointment next year and will continue observation."   Exercise myoview stress 06/05/2018 [facility] CV):  1. The patient performed treadmill exercise using Bruce protocol, completing 5:07 minutes. The patient completed an estimated workload of 7 METS, reaching 92% of the maximum predicted heart rate. Exercise capacity was low.. Resting hypertension 142/106 mmHg, with exaggerated exercise response,  peak BP reaching 220/80 mmHg. No stress symptoms reported. No ischemic changes seen on stress electrocardiogram.  2. The overall quality of the study is good. There is no evidence of abnormal lung activity. Stress and rest SPECT images demonstrate homogeneous tracer distribution throughout the myocardium. Gated SPECT imaging reveals normal myocardial thickening and wall motion. The left ventricular ejection fraction was calculated 48%, although visually appears normal.  3. Low risk study.   Past Medical History:  Diagnosis Date   Anemia, unspecified    Aortic regurgitation    Aortic root dilation (HCC)    Atrial fibrillation (HCC)    Atypical chest pain 06/30/2018   CKD (chronic kidney disease) stage 3, GFR 30-59 ml/min ([place]) 06/30/2018   Dysrhythmia    Essential hypertension 06/30/2018   H/O TIA (transient ischemic attack) and stroke 06/30/2005   Hemorrhoids    History of colon polyps 05/28/2007   Adenomatous polyp(Colonoscopy with Dr. Lajoyce Corners)   Hx of adenomatous colonic polyps 02/24/2003   2004,2006,2009 Dr. Lajoyce Corners   Hyperlipemia    Hypertension    Internal hemorrhoids, bleeding and Grade 3 prolapse 12/09/2014   Wears dentures     Past Surgical History:  Procedure Laterality Date   CARDIOVERSION N/A 03/02/2015   Procedure: CARDIOVERSION;  Surgeon: Adrian Prows, MD;  Location: [facility];  Service: Cardiovascular;  Laterality: N/A;   COLONOSCOPY  2009   HEMORRHOID BANDING  2016   INGUINAL HERNIA REPAIR Left 08/10/2015   Procedure: LEFT INGUINAL HERNIA REPAIR WITH MESH;  Surgeon: Donnie Mesa, MD;  Location: [facility];  Service: General;  Laterality: Left;   INSERTION OF MESH Left 08/10/2015   Procedure: INSERTION OF MESH;  Surgeon: Donnie Mesa, MD;  Location: [place];  Service: General;  Laterality: Left;   MULTIPLE TOOTH EXTRACTIONS      MEDICATIONS:  apixaban (ELIQUIS) 2.5 MG TABS tablet   bisacodyl (DULCOLAX) 5 MG EC tablet   diltiazem (CARDIZEM CD) 300 MG 24 hr capsule    ezetimibe (ZETIA) 10 MG tablet   losartan-hydrochlorothiazide (HYZAAR) 100-12.5 MG tablet   rosuvastatin (CRESTOR) 20 MG tablet   Tetrahydrozoline HCl (VISINE OP)   No current facility-administered medications for this encounter.    Myra Gianotti, PA-C Surgical Short Stay/Anesthesiology [facility] Phone [phone] [facility] Phone [phone] 03/10/2021 2:18 PM

## 2021-03-10 NOTE — Anesthesia Preprocedure Evaluation (Deleted)
Anesthesia Evaluation    Reviewed: Allergy & Precautions, Patient's Chart, lab work & pertinent test results  Airway        Dental   Pulmonary former smoker,  Quit smoking 1991, 15 pack year history           Cardiovascular hypertension, Pt. on medications + dysrhythmias (eliquis- LD: ) Atrial Fibrillation   07/21/18: 1. The left ventricle has normal systolic function, with an ejection  fraction of 55-60%. The cavity size was normal. There is mildly increased  left ventricular wall thickness. Left ventricular diastolic Doppler  parameters are indeterminate in the  setting of atrial fibrillation.  2. The right ventricle has normal systolic function. The cavity was  normal. There is no increase in right ventricular wall thickness. Right  ventricular systolic pressure is moderately elevated with an estimated  pressure of 45.9 mmHg.  3. The aortic valve is tricuspid. Aortic valve regurgitation is mild by  color flow Doppler. Mild aortic annular calcification noted.  4. The mitral valve is normal in structure.  5. The tricuspid valve is normal in structure.  6. There is mild to moderate dilatation of the aortic root.  7. The inferior vena cava was dilated in size with <50% respiratory  variability.    Neuro/Psych TIAnegative psych ROS   GI/Hepatic negative GI ROS, Neg liver ROS,   Endo/Other  negative endocrine ROS  Renal/GU CRFRenal diseaseCr 1.7, CKD 3  negative genitourinary   Musculoskeletal negative musculoskeletal ROS (+)   Abdominal   Peds  Hematology negative hematology ROS (+) hct 33, plt 128   Anesthesia Other Findings   Reproductive/Obstetrics negative OB ROS                            Anesthesia Physical Anesthesia Plan  ASA: 3  Anesthesia Plan: General   Post-op Pain Management:    Induction: Intravenous  PONV Risk Score and Plan: 2 and Ondansetron, Dexamethasone and  Treatment may vary due to age or medical condition  Airway Management Planned: Oral ETT  Additional Equipment: None  Intra-op Plan:   Post-operative Plan: Extubation in OR  Informed Consent:   Plan Discussed with:   Anesthesia Plan Comments:        Anesthesia Quick Evaluation

## 2021-03-13 ENCOUNTER — Other Ambulatory Visit: Payer: Self-pay

## 2021-03-13 ENCOUNTER — Emergency Department (HOSPITAL_COMMUNITY): Payer: Medicare Other

## 2021-03-13 ENCOUNTER — Encounter (HOSPITAL_COMMUNITY): Payer: Self-pay | Admitting: *Deleted

## 2021-03-13 ENCOUNTER — Emergency Department (HOSPITAL_COMMUNITY)
Admission: EM | Admit: 2021-03-13 | Discharge: 2021-03-13 | Disposition: A | Discharge status: Home / Self Care | Payer: Medicare Other | Attending: Emergency Medicine | Admitting: Emergency Medicine

## 2021-03-13 DIAGNOSIS — R531 Weakness: Secondary | ICD-10-CM | POA: Diagnosis not present

## 2021-03-13 DIAGNOSIS — R209 Unspecified disturbances of skin sensation: Secondary | ICD-10-CM | POA: Diagnosis not present

## 2021-03-13 DIAGNOSIS — N183 Chronic kidney disease, stage 3 unspecified: Secondary | ICD-10-CM | POA: Insufficient documentation

## 2021-03-13 DIAGNOSIS — I639 Cerebral infarction, unspecified: Secondary | ICD-10-CM | POA: Insufficient documentation

## 2021-03-13 DIAGNOSIS — R29818 Other symptoms and signs involving the nervous system: Secondary | ICD-10-CM | POA: Diagnosis not present

## 2021-03-13 DIAGNOSIS — R200 Anesthesia of skin: Secondary | ICD-10-CM | POA: Diagnosis not present

## 2021-03-13 DIAGNOSIS — I129 Hypertensive chronic kidney disease with stage 1 through stage 4 chronic kidney disease, or unspecified chronic kidney disease: Secondary | ICD-10-CM | POA: Insufficient documentation

## 2021-03-13 DIAGNOSIS — Z20822 Contact with and (suspected) exposure to covid-19: Secondary | ICD-10-CM | POA: Insufficient documentation

## 2021-03-13 DIAGNOSIS — I4891 Unspecified atrial fibrillation: Secondary | ICD-10-CM | POA: Insufficient documentation

## 2021-03-13 DIAGNOSIS — Z79899 Other long term (current) drug therapy: Secondary | ICD-10-CM | POA: Insufficient documentation

## 2021-03-13 DIAGNOSIS — I6381 Other cerebral infarction due to occlusion or stenosis of small artery: Secondary | ICD-10-CM

## 2021-03-13 DIAGNOSIS — R9431 Abnormal electrocardiogram [ECG] [EKG]: Secondary | ICD-10-CM | POA: Diagnosis not present

## 2021-03-13 DIAGNOSIS — Z7901 Long term (current) use of anticoagulants: Secondary | ICD-10-CM | POA: Diagnosis not present

## 2021-03-13 DIAGNOSIS — Z87891 Personal history of nicotine dependence: Secondary | ICD-10-CM | POA: Diagnosis not present

## 2021-03-13 DIAGNOSIS — I6523 Occlusion and stenosis of bilateral carotid arteries: Secondary | ICD-10-CM | POA: Diagnosis not present

## 2021-03-13 LAB — URINALYSIS, ROUTINE W REFLEX MICROSCOPIC
Bacteria, UA: NONE SEEN
Bilirubin Urine: NEGATIVE
Glucose, UA: NEGATIVE mg/dL
Ketones, ur: NEGATIVE mg/dL
Leukocytes,Ua: NEGATIVE
Nitrite: NEGATIVE
Protein, ur: NEGATIVE mg/dL
Specific Gravity, Urine: 1.026 (ref 1.005–1.030)
pH: 6 (ref 5.0–8.0)

## 2021-03-13 LAB — RAPID URINE DRUG SCREEN, HOSP PERFORMED
Amphetamines: NOT DETECTED
Barbiturates: NOT DETECTED
Benzodiazepines: NOT DETECTED
Cocaine: NOT DETECTED
Opiates: NOT DETECTED
Tetrahydrocannabinol: NOT DETECTED

## 2021-03-13 LAB — CBC
HCT: 32.1 % — ABNORMAL LOW (ref 39.0–52.0)
Hemoglobin: 11 g/dL — ABNORMAL LOW (ref 13.0–17.0)
MCH: 30.4 pg (ref 26.0–34.0)
MCHC: 34.3 g/dL (ref 30.0–36.0)
MCV: 88.7 fL (ref 80.0–100.0)
Platelets: 128 10*3/uL — ABNORMAL LOW (ref 150–400)
RBC: 3.62 MIL/uL — ABNORMAL LOW (ref 4.22–5.81)
RDW: 13.2 % (ref 11.5–15.5)
WBC: 3.7 10*3/uL — ABNORMAL LOW (ref 4.0–10.5)
nRBC: 0 % (ref 0.0–0.2)

## 2021-03-13 LAB — LIPID PANEL
Cholesterol: 118 mg/dL (ref 0–200)
HDL: 54 mg/dL (ref >40)
LDL Cholesterol: 59 mg/dL (ref 0–99)
Total CHOL/HDL Ratio: 2.2 RATIO
Triglycerides: 25 mg/dL (ref <150)
VLDL: 5 mg/dL (ref 0–40)

## 2021-03-13 LAB — COMPREHENSIVE METABOLIC PANEL
ALT: 11 U/L (ref 0–44)
AST: 17 U/L (ref 15–41)
Albumin: 3.4 g/dL — ABNORMAL LOW (ref 3.5–5.0)
Alkaline Phosphatase: 50 U/L (ref 38–126)
Anion gap: 6 (ref 5–15)
BUN: 18 mg/dL (ref 8–23)
CO2: 27 mmol/L (ref 22–32)
Calcium: 8.9 mg/dL (ref 8.9–10.3)
Chloride: 106 mmol/L (ref 98–111)
Creatinine, Ser: 1.67 mg/dL — ABNORMAL HIGH (ref 0.61–1.24)
GFR, Estimated: 40 mL/min — ABNORMAL LOW (ref >60)
Glucose, Bld: 98 mg/dL (ref 70–99)
Potassium: 3.4 mmol/L — ABNORMAL LOW (ref 3.5–5.1)
Sodium: 139 mmol/L (ref 135–145)
Total Bilirubin: 0.5 mg/dL (ref 0.3–1.2)
Total Protein: 6.1 g/dL — ABNORMAL LOW (ref 6.5–8.1)

## 2021-03-13 LAB — DIFFERENTIAL
Abs Immature Granulocytes: 0.01 10*3/uL (ref 0.00–0.07)
Basophils Absolute: 0 10*3/uL (ref 0.0–0.1)
Basophils Relative: 0 %
Eosinophils Absolute: 0.2 10*3/uL (ref 0.0–0.5)
Eosinophils Relative: 4 %
Immature Granulocytes: 0 %
Lymphocytes Relative: 26 %
Lymphs Abs: 1 10*3/uL (ref 0.7–4.0)
Monocytes Absolute: 0.3 10*3/uL (ref 0.1–1.0)
Monocytes Relative: 8 %
Neutro Abs: 2.3 10*3/uL (ref 1.7–7.7)
Neutrophils Relative %: 62 %

## 2021-03-13 LAB — I-STAT CHEM 8, ED
BUN: 22 mg/dL (ref 8–23)
Calcium, Ion: 1.11 mmol/L — ABNORMAL LOW (ref 1.15–1.40)
Chloride: 103 mmol/L (ref 98–111)
Creatinine, Ser: 1.7 mg/dL — ABNORMAL HIGH (ref 0.61–1.24)
Glucose, Bld: 90 mg/dL (ref 70–99)
HCT: 33 % — ABNORMAL LOW (ref 39.0–52.0)
Hemoglobin: 11.2 g/dL — ABNORMAL LOW (ref 13.0–17.0)
Potassium: 3.5 mmol/L (ref 3.5–5.1)
Sodium: 142 mmol/L (ref 135–145)
TCO2: 27 mmol/L (ref 22–32)

## 2021-03-13 LAB — APTT: aPTT: 32 seconds (ref 24–36)

## 2021-03-13 LAB — ETHANOL: Alcohol, Ethyl (B): <10 mg/dL (ref <10)

## 2021-03-13 LAB — HEMOGLOBIN A1C
Hgb A1c MFr Bld: 5.7 % — ABNORMAL HIGH (ref 4.8–5.6)
Mean Plasma Glucose: 116.89 mg/dL

## 2021-03-13 LAB — PROTIME-INR
INR: 1.4 — ABNORMAL HIGH (ref 0.8–1.2)
Prothrombin Time: 17 seconds — ABNORMAL HIGH (ref 11.4–15.2)

## 2021-03-13 LAB — RESP PANEL BY RT-PCR (FLU A&B, COVID) ARPGX2
Influenza A by PCR: NEGATIVE
Influenza B by PCR: NEGATIVE
SARS Coronavirus 2 by RT PCR: NEGATIVE

## 2021-03-13 LAB — CBG MONITORING, ED: Glucose-Capillary: 76 mg/dL (ref 70–99)

## 2021-03-13 MED ORDER — IOHEXOL 350 MG/ML SOLN
100.0000 mL | Freq: Once | INTRAVENOUS | Status: AC | PRN
  Administered 2021-03-13: 100 mL via INTRAVENOUS

## 2021-03-13 MED ORDER — MAGNESIUM OXIDE -MG SUPPLEMENT 400 (240 MG) MG PO TABS
400.0000 mg | ORAL_TABLET | Freq: Once | ORAL | Status: AC
  Administered 2021-03-13: 400 mg via ORAL
  Filled 2021-03-13: qty 1

## 2021-03-13 MED ORDER — POTASSIUM CHLORIDE CRYS ER 20 MEQ PO TBCR
20.0000 meq | EXTENDED_RELEASE_TABLET | Freq: Once | ORAL | Status: AC
  Administered 2021-03-13: 20 meq via ORAL
  Filled 2021-03-13: qty 1

## 2021-03-13 NOTE — ED Triage Notes (Signed)
BIB [facility] from home for decreased sensation on Left side: face, arm and leg. LVO negative. No obvious defecits. LKN at bedtime 2200, sx noted at 0400 upon waking. Arrives as code stroke. Takes eliquis. Surgery scheduled for next week. No fall or injury. Alert, NAD, calm, interactive. Bridge to CT.

## 2021-03-13 NOTE — ED Notes (Signed)
Back to room from CT, no changes, daughter at [facility]. Remains alert, NAD, calm, interactive.

## 2021-03-13 NOTE — ED Notes (Signed)
No changes. Pt to MRI.

## 2021-03-13 NOTE — ED Provider Notes (Signed)
[facility] EMERGENCY DEPARTMENT Provider Note   CSN: [id number] Arrival date & time: 03/13/21  2376  An emergency department physician performed an initial assessment on this suspected stroke patient at 0712.  History Chief Complaint  Patient presents with   Code Stroke    Decreased sensation L side    Adrian Daniels is a 86 y.o. male.  HPI Patient with history of CVA 25 years ago presenting for left-sided numbness.  Onset of symptoms was 3 hours prior to arrival.  He reports that his remote stroke presented with the same symptoms.  No neurologic deficits reported by EMS.  Currently, patient continues to endorse the sensation of left hemibody numbness.  He arrives as a code stroke.  Patient lives alone independently.  He denies any recent symptoms prior to this morning.  He is on Eliquis for atrial fibrillation.  Other medical history is notable for hypertension, hyperlipidemia, and CKD stage III.    Past Medical History:  Diagnosis Date   Anemia, unspecified    Aortic regurgitation    Aortic root dilation (HCC)    Atrial fibrillation (HCC)    Atypical chest pain 06/30/2018   CKD (chronic kidney disease) stage 3, GFR 30-59 ml/min ([place]) 06/30/2018   Dysrhythmia    Essential hypertension 06/30/2018   H/O TIA (transient ischemic attack) and stroke 06/30/2005   Hemorrhoids    History of colon polyps 05/28/2007   Adenomatous polyp(Colonoscopy with Dr. Lajoyce Corners)   Hx of adenomatous colonic polyps 02/24/2003   2004,2006,2009 Dr. Lajoyce Corners   Hyperlipemia    Hypertension    Internal hemorrhoids, bleeding and Grade 3 prolapse 12/09/2014   Wears dentures     Patient Active Problem List   Diagnosis Date Noted   Hypokalemia 07/21/2018   Near syncope 07/20/2018   Atypical chest pain 06/30/2018   Essential hypertension 06/30/2018   CKD (chronic kidney disease) stage 3, GFR 30-59 ml/min ([place]) 06/30/2018   Laboratory examination 06/30/2018   Atrial fibrillation ([employer])  12/09/2014   H/O TIA (transient ischemic attack) and stroke 06/30/2005   Hx of adenomatous colonic polyps 02/24/2003    Past Surgical History:  Procedure Laterality Date   CARDIOVERSION N/A 03/02/2015   Procedure: CARDIOVERSION;  Surgeon: Adrian Prows, MD;  Location: [place];  Service: Cardiovascular;  Laterality: N/A;   COLONOSCOPY  2009   HEMORRHOID BANDING  2016   INGUINAL HERNIA REPAIR Left 08/10/2015   Procedure: LEFT INGUINAL HERNIA REPAIR WITH MESH;  Surgeon: Donnie Mesa, MD;  Location: [place];  Service: General;  Laterality: Left;   INSERTION OF MESH Left 08/10/2015   Procedure: INSERTION OF MESH;  Surgeon: Donnie Mesa, MD;  Location: [place];  Service: General;  Laterality: Left;   MULTIPLE TOOTH EXTRACTIONS         Family History  Problem Relation Age of Onset   Diabetes Daughter    Cervical cancer Sister    Colon cancer Neg Hx    Colon polyps Neg Hx     Social History   Tobacco Use   Smoking status: Former    Packs/day: 0.50    Years: 30.00    Pack years: 15.00    Types: Cigarettes    Quit date: 07/08/1989    Years since quitting: 31.7   Smokeless tobacco: Never  Vaping Use   Vaping Use: Never used  Substance Use Topics   Alcohol use: Yes    Alcohol/week: 4.0 standard drinks    Types: 4 Standard drinks or equivalent per  week    Comment: 4 per week   Drug use: No    Home Medications Prior to Admission medications   Medication Sig Start Date End Date Taking? Authorizing Provider  apixaban (ELIQUIS) 2.5 MG TABS tablet Take 1 tablet by mouth 2 (two) times daily.    [provider]  bisacodyl (DULCOLAX) 5 MG EC tablet Take 10 mg by mouth daily as needed for moderate constipation.    [provider]  diltiazem (CARDIZEM CD) 300 MG 24 hr capsule Take 300 mg by mouth daily.    [provider]  ezetimibe (ZETIA) 10 MG tablet Take 10 mg by mouth daily. 11/11/20   [provider]  losartan-hydrochlorothiazide (HYZAAR) 100-12.5  MG tablet Take 1 tablet by mouth daily. 07/13/18   [provider]  rosuvastatin (CRESTOR) 20 MG tablet Take 1 tablet by mouth daily. 06/08/20   [provider]  Tetrahydrozoline HCl (VISINE OP) Place 1 drop into both eyes daily as needed (dry eyes).    [provider]    Allergies    Patient has no known allergies.  Review of Systems   Review of Systems  Constitutional:  Negative for activity change, chills, fatigue and fever.  HENT:  Negative for ear pain and sore throat.   Eyes:  Negative for pain and visual disturbance.  Respiratory:  Negative for cough, chest tightness and shortness of breath.   Cardiovascular:  Negative for chest pain and palpitations.  Gastrointestinal:  Negative for abdominal pain, nausea and vomiting.  Genitourinary:  Negative for dysuria, flank pain and hematuria.  Musculoskeletal:  Negative for arthralgias, back pain, myalgias and neck pain.  Skin:  Negative for color change and rash.  Neurological:  Positive for numbness. Negative for dizziness, seizures, syncope, facial asymmetry, weakness and light-headedness.  Hematological:  Bruises/bleeds easily (On Eliquis).  Psychiatric/Behavioral:  Negative for confusion and decreased concentration.   All other systems reviewed and are negative.  Physical Exam Updated Vital Signs BP (!) 147/110   Pulse (!) 51   Temp 98.1 F (36.7 C) (Oral)   Resp 14   Wt 67.7 kg   SpO2 100%   BMI 20.81 kg/m   Physical Exam Vitals and nursing note reviewed.  Constitutional:      General: He is not in acute distress.    Appearance: Normal appearance. He is well-developed and normal weight. He is not ill-appearing or toxic-appearing.  HENT:     Head: Normocephalic and atraumatic.     Right Ear: External ear normal.     Left Ear: External ear normal.     Nose: Nose normal.     Mouth/Throat:     Mouth: Mucous membranes are moist.     Pharynx: Oropharynx is clear.  Eyes:     General: No scleral  icterus.    Extraocular Movements: Extraocular movements intact.     Conjunctiva/sclera: Conjunctivae normal.  Cardiovascular:     Rate and Rhythm: Normal rate. Rhythm irregular.     Heart sounds: No murmur heard. Pulmonary:     Effort: Pulmonary effort is normal. No respiratory distress.     Breath sounds: Normal breath sounds. No wheezing or rales.  Abdominal:     Palpations: Abdomen is soft.     Tenderness: There is no abdominal tenderness.  Musculoskeletal:        General: Normal range of motion.     Cervical back: Normal range of motion and neck supple. No rigidity or tenderness.  Skin:  General: Skin is warm and dry.     Coloration: Skin is not jaundiced or pale.  Neurological:     General: No focal deficit present.     Mental Status: He is alert and oriented to person, place, and time.     Cranial Nerves: No cranial nerve deficit.     Sensory: Sensory deficit (Mildly decreased sensation in left hemibody.) present.     Motor: No weakness.     Coordination: Coordination normal.  Psychiatric:        Mood and Affect: Mood normal.        Behavior: Behavior normal.        Thought Content: Thought content normal.        Judgment: Judgment normal.    ED Results / Procedures / Treatments   Labs (all labs ordered are listed, but only abnormal results are displayed) Labs Reviewed  PROTIME-INR - Abnormal; Notable for the following components:      Result Value   Prothrombin Time 17.0 (*)    INR 1.4 (*)    All other components within normal limits  CBC - Abnormal; Notable for the following components:   WBC 3.7 (*)    RBC 3.62 (*)    Hemoglobin 11.0 (*)    HCT 32.1 (*)    Platelets 128 (*)    All other components within normal limits  COMPREHENSIVE METABOLIC PANEL - Abnormal; Notable for the following components:   Potassium 3.4 (*)    Creatinine, Ser 1.67 (*)    Total Protein 6.1 (*)    Albumin 3.4 (*)    GFR, Estimated 40 (*)    All other components within normal  limits  URINALYSIS, ROUTINE W REFLEX MICROSCOPIC - Abnormal; Notable for the following components:   Color, Urine STRAW (*)    Hgb urine dipstick SMALL (*)    All other components within normal limits  I-STAT CHEM 8, ED - Abnormal; Notable for the following components:   Creatinine, Ser 1.70 (*)    Calcium, Ion 1.11 (*)    Hemoglobin 11.2 (*)    HCT 33.0 (*)    All other components within normal limits  RESP PANEL BY RT-PCR (FLU A&B, COVID) ARPGX2  ETHANOL  APTT  DIFFERENTIAL  RAPID URINE DRUG SCREEN, HOSP PERFORMED  LIPID PANEL  HEMOGLOBIN A1C  CBG MONITORING, ED    EKG EKG Interpretation  Date/Time:  Sunday March 13 2021 07:54:24 EDT Ventricular Rate:  71 PR Interval:    QRS Duration: 103 QT Interval:  430 QTC Calculation: 468 R Axis:   51 Text Interpretation: Atrial fibrillation Borderline low voltage, extremity leads Confirmed by Godfrey Pick ([id number]) on 03/13/2021 8:04:24 AM  Radiology CT ANGIO HEAD NECK W WO CM  Result Date: 03/13/2021 CLINICAL DATA:  Neuro deficit with acute stroke suspected EXAM: CT ANGIOGRAPHY HEAD AND NECK TECHNIQUE: Multidetector CT imaging of the head and neck was performed using the standard protocol during bolus administration of intravenous contrast. Multiplanar CT image reconstructions and MIPs were obtained to evaluate the vascular anatomy. Carotid stenosis measurements (when applicable) are obtained utilizing NASCET criteria, using the distal internal carotid diameter as the denominator. CONTRAST:  184mL OMNIPAQUE IOHEXOL 350 MG/ML SOLN COMPARISON:  Noncontrast head CT from earlier the same day FINDINGS: CTA NECK FINDINGS Aortic arch: The great vessel origins are not covered. No acute finding Right carotid system: Mild for age atheromatous plaque at the bifurcation. No stenosis or ulceration Left carotid system: Mild for age atheromatous plaque at  the bifurcation. No stenosis or ulceration. Vertebral arteries: No proximal subclavian stenosis. The  vertebral arteries are smoothly contoured and widely patent to the dura. Skeleton: Cervical disc and facet degeneration which is advanced. Other neck: No acute finding. Upper chest: Negative. Review of the MIP images confirms the above findings CTA HEAD FINDINGS Anterior circulation: No significant stenosis, proximal occlusion, aneurysm, or vascular malformation. Atheromatous carotids. Posterior circulation: No significant stenosis, proximal occlusion, aneurysm, or vascular malformation. Venous sinuses: Patent. Anatomic variants: Fetal type PCAs. Review of the MIP images confirms the above findings IMPRESSION: 1. No emergent finding. 2. Atherosclerosis without major vessel flow limiting stenosis. Electronically Signed   By: Jorje Guild M.D.   On: 03/13/2021 08:14   MR BRAIN WO CONTRAST  Result Date: 03/13/2021 CLINICAL DATA:  Neuro deficit with acute stroke suspected EXAM: MRI HEAD WITHOUT CONTRAST TECHNIQUE: Multiplanar, multiecho pulse sequences of the brain and surrounding structures were obtained without intravenous contrast. COMPARISON:  Head CT and CTA from earlier the same day. Brain MRI 05/31/2005 FINDINGS: Brain: No acute infarct, hemorrhage, hydrocephalus, collection, or masslike finding. There could be a tiny subacute infarct in the right centrum semiovale. Background of advanced chronic small vessel ischemia. Age normal brain volume. No chronic blood products. Vascular: Preserved flow voids Skull and upper cervical spine: Normal marrow signal. Notable left-sided facet spurring at C3-4 Sinuses/Orbits: Negative IMPRESSION: 1. No acute finding. Equivocal for subacute lacunar infarct in the right centrum semiovale. 2. Extensive chronic small vessel ischemia in the hemispheric white matter, notably progressed from 2007 Electronically Signed   By: Jorje Guild M.D.   On: 03/13/2021 09:13   CT HEAD CODE STROKE WO CONTRAST  Result Date: 03/13/2021 CLINICAL DATA:  Code stroke. Neuro deficit with  acute stroke suspected EXAM: CT HEAD WITHOUT CONTRAST TECHNIQUE: Contiguous axial images were obtained from the base of the skull through the vertex without intravenous contrast. COMPARISON:  07/27/2008 FINDINGS: Brain: No evidence of acute infarction, hemorrhage, hydrocephalus, extra-axial collection or mass lesion/mass effect. Confluent chronic small vessel ischemia in the deep white matter, progressed from prior. There is also been cortical volume loss since prior. Vascular: No hyperdense vessel or unexpected calcification. Skull: Normal. Negative for fracture or focal lesion. Sinuses/Orbits: No acute finding. Other: These results were communicated to Dr. Lorrin Goodell at 7:31 am on 03/13/2021 by text page via the [facility] messaging system. ASPECTS [facility] Stroke Program Early CT Score) Not scored with this history IMPRESSION: 1. No acute finding 2. Extensive chronic small vessel disease with progression from 2010 comparison. Electronically Signed   By: Jorje Guild M.D.   On: 03/13/2021 07:32    Procedures Procedures   Medications Ordered in ED Medications  iohexol (OMNIPAQUE) 350 MG/ML injection 100 mL (100 mLs Intravenous Contrast Given 03/13/21 0751)  potassium chloride SA (KLOR-CON) CR tablet 20 mEq (20 mEq Oral Given 03/13/21 1139)  magnesium oxide (MAG-OX) tablet 400 mg (400 mg Oral Given 03/13/21 1139)    ED Course  I have reviewed the triage vital signs and the nursing notes.  Pertinent labs & imaging results that were available during my care of the patient were reviewed by me and considered in my medical decision making (see chart for details).    MDM Rules/Calculators/A&P                         CRITICAL CARE Performed by: Godfrey Pick   Total critical care time: 35 minutes  Critical care time was exclusive of separately  billable procedures and treating other patients.  Critical care was necessary to treat or prevent imminent or life-threatening deterioration.  Critical care  was time spent personally by me on the following activities: development of treatment plan with patient and/or surrogate as well as nursing, discussions with consultants, evaluation of patient's response to treatment, examination of patient, obtaining history from patient or surrogate, ordering and performing treatments and interventions, ordering and review of laboratory studies, ordering and review of radiographic studies, pulse oximetry and re-evaluation of patient's condition.  Patient presents as a code stroke after awakening with sensation of numbness in his left hemibody.  He reports that he had similar symptoms during his remote stroke which is estimated be 25 years ago.  On arrival, he is alert and oriented.  He denies discomfort.  Patient was taken directly to [facility].  Following no acute findings on noncontrasted CT scan, neurology advised CTA of head and neck in addition to MRI of brain.  These further imaging studies also showed no acute findings.  Symptoms could be secondary to recrudescence from his prior stroke.  Laboratory work-up did not show any findings to suggest underlying cause of this.  Potassium was slightly low and was replaced, along with magnesium.  Patient is currently on Eliquis.  Neurology does not recommend initiation of antiplatelet therapy.  They also do not recommend any further work-up at this time.  On reassessment, patient is feeling well.  His daughter is at bedside.  He does feel comfortable with discharge home.  He was advised to continue his current medications and to return to the ED for any recurrence of symptoms.  He was discharged in stable condition.  Final Clinical Impression(s) / ED Diagnoses Final diagnoses:  Numbness    Rx / DC Orders ED Discharge Orders     None        Godfrey Pick, MD 03/13/21 1753

## 2021-03-13 NOTE — Consult Note (Addendum)
NEUROLOGY CONSULTATION NOTE   Date of service: March 13, 2021 Patient Name: Adrian Daniels MRN:  [id number] DOB:  10-08-1934 Reason for consult: "L sided numbness" Requesting Provider: Godfrey Pick, MD _ _ _   _ __   _ __ _ _  __ __   _ __   __ _  History of Present Illness  Adrian Daniels is a 86 y.o. male with PMH significant for aortic regurgitation, atrial fibrillation on Eliquis, CKD stage III, prior history of strokes, hypertension, hyperlipidemia, history of bleeding who presents with left-sided numbness.  Patient went to bed at 2200 on 03/12/2021 and woke up at 40400 on 03/13/2021 with left-sided numbness.  mRS: 3 tNK: not offered, on eliquis. Thrombectomy: not offered, symptoms are mild. NIHSS components Score: Comment  1a Level of Conscious 0[x]  1[]  2[]  3[]      1b LOC Questions 0[x]  1[]  2[]       1c LOC Commands 0[x]  1[]  2[]       2 Best Gaze 0[x]  1[]  2[]       3 Visual 0[x]  1[]  2[]  3[]      4 Facial Palsy 0[x]  1[]  2[]  3[]      5a Motor Arm - left 0[x]  1[]  2[]  3[]  4[]  UN[]    5b Motor Arm - Right 0[x]  1[]  2[]  3[]  4[]  UN[]    6a Motor Leg - Left 0[x]  1[]  2[]  3[]  4[]  UN[]    6b Motor Leg - Right 0[x]  1[]  2[]  3[]  4[]  UN[]    7 Limb Ataxia 0[x]  1[]  2[]  3[]  UN[]     8 Sensory 0[]  1[]  2[x]  UN[]      9 Best Language 0[x]  1[]  2[]  3[]      10 Dysarthria 0[x]  1[]  2[]  UN[]      11 Extinct. and Inattention 0[x]  1[]  2[]       TOTAL: 2      ROS   Constitutional Denies weight loss, fever and chills.   HEENT Denies changes in vision and hearing.   Respiratory Denies SOB and cough.   CV Denies palpitations and CP   GI Denies abdominal pain, nausea, vomiting and diarrhea.   GU Denies dysuria and urinary frequency.   MSK Denies myalgia and joint pain.   Skin Denies rash and pruritus.   Neurological Denies headache and syncope.   Psychiatric Denies recent changes in mood. Denies anxiety and depression.    Past History   Past Medical History:  Diagnosis Date   Anemia, unspecified     Aortic regurgitation    Aortic root dilation (HCC)    Atrial fibrillation (HCC)    Atypical chest pain 06/30/2018   CKD (chronic kidney disease) stage 3, GFR 30-59 ml/min ([place]) 06/30/2018   Dysrhythmia    Essential hypertension 06/30/2018   H/O TIA (transient ischemic attack) and stroke 06/30/2005   Hemorrhoids    History of colon polyps 05/28/2007   Adenomatous polyp(Colonoscopy with Dr. Lajoyce Corners)   Hx of adenomatous colonic polyps 02/24/2003   2004,2006,2009 Dr. Lajoyce Corners   Hyperlipemia    Hypertension    Internal hemorrhoids, bleeding and Grade 3 prolapse 12/09/2014   Wears dentures    Past Surgical History:  Procedure Laterality Date   CARDIOVERSION N/A 03/02/2015   Procedure: CARDIOVERSION;  Surgeon: Adrian Prows, MD;  Location: [facility] ENDOSCOPY;  Service: Cardiovascular;  Laterality: N/A;   COLONOSCOPY  2009   HEMORRHOID BANDING  2016   INGUINAL HERNIA REPAIR Left 08/10/2015   Procedure: LEFT INGUINAL HERNIA REPAIR WITH MESH;  Surgeon: Donnie Mesa, MD;  Location: [place];  Service:  General;  Laterality: Left;   INSERTION OF MESH Left 08/10/2015   Procedure: INSERTION OF MESH;  Surgeon: Donnie Mesa, MD;  Location: [facility] OR;  Service: General;  Laterality: Left;   MULTIPLE TOOTH EXTRACTIONS     Family History  Problem Relation Age of Onset   Diabetes Daughter    Cervical cancer Sister    Colon cancer Neg Hx    Colon polyps Neg Hx    Social History   Socioeconomic History   Marital status: Single    Spouse name: Not on file   Number of children: 7   Years of education: Not on file   Highest education level: Not on file  Occupational History   Not on file  Tobacco Use   Smoking status: Former    Packs/day: 0.50    Years: 30.00    Pack years: 15.00    Types: Cigarettes    Quit date: 07/08/1989    Years since quitting: 31.7   Smokeless tobacco: Never  Vaping Use   Vaping Use: Never used  Substance and Sexual Activity   Alcohol use: Yes    Alcohol/week: 4.0 standard drinks     Types: 4 Standard drinks or equivalent per week    Comment: 4 per week   Drug use: No   Sexual activity: Not on file  Other Topics Concern   Not on file  Social History Narrative   Not on file   Social Determinants of Health   Financial Resource Strain: Not on file  Food Insecurity: Not on file  Transportation Needs: Not on file  Physical Activity: Not on file  Stress: Not on file  Social Connections: Not on file   No Known Allergies  Medications  (Not in a hospital admission)    Vitals   There were no vitals filed for this visit.   There is no height or weight on file to calculate BMI.  Physical Exam   General: Laying comfortably in bed; in no acute distress.  HENT: Normal oropharynx and mucosa. Normal external appearance of ears and nose.  Neck: Supple, no pain or tenderness  CV: No JVD. No peripheral edema.  Pulmonary: Symmetric Chest rise. Normal respiratory effort.  Abdomen: Soft to touch, non-tender.  Ext: No cyanosis, edema, or deformity  Skin: No rash. Normal palpation of skin.   Musculoskeletal: Normal digits and nails by inspection. No clubbing.   Neurologic Examination  Mental status/Cognition: Alert, oriented to self, place, month and year, good attention.  Speech/language: Fluent, comprehension intact, object naming intact, repetition intact.  Cranial nerves:   CN II Pupils equal and reactive to light, no VF deficits    CN III,IV,VI EOM intact, no gaze preference or deviation, no nystagmus    CN V normal sensation in V1, V2, and V3 segments bilaterally    CN VII no asymmetry, no nasolabial fold flattening    CN VIII normal hearing to speech    CN IX & X normal palatal elevation, no uvular deviation    CN XI 5/5 head turn and 5/5 shoulder shrug bilaterally    CN XII midline tongue protrusion    Motor:  Muscle bulk: poor, tone normal, pronator drift none tremor none Mvmt Root Nerve  Muscle Right Left Comments  SA C5/6 Ax Deltoid 5 5   EF C5/6 Mc  Biceps 5 5   EE C6/7/8 Rad Triceps 5 5   WF C6/7 Med FCR     WE C7/8 PIN ECU  F Ab C8/T1 U ADM/FDI 5 5   HF L1/2/3 Fem Illopsoas 5 5   KE L2/3/4 Fem Quad 5 5   DF L4/5 D Peron Tib Ant 5 5   PF S1/2 Tibial Grc/Sol 5 5    Reflexes:  Right Left Comments  Pectoralis      Biceps (C5/6) 2 2   Brachioradialis (C5/6) 2 2    Triceps (C6/7) 2 2    Patellar (L3/4) 2 2    Achilles (S1)      Hoffman      Plantar     Jaw jerk    Sensation:  Light touch Decreased mildly in left face, left arm and left leg.   Pin prick    Temperature    Vibration   Proprioception    Coordination/Complex Motor:  - Finger to Nose intact BL - Heel to shin intact BL - Rapid alternating movement intact BL - Gait: Deferred.  Labs   CBC:  Recent Labs  Lab 03/09/21 1033  WBC 3.4*  HGB 10.8*  HCT 32.1*  MCV 88.7  PLT 134*    Basic Metabolic Panel:  Lab Results  Component Value Date   NA 140 03/09/2021   K 3.4 (L) 03/09/2021   CO2 28 03/09/2021   GLUCOSE 89 03/09/2021   BUN 17 03/09/2021   CREATININE 1.63 (H) 03/09/2021   CALCIUM 8.7 (L) 03/09/2021   GFRNONAA 41 (L) 03/09/2021   GFRAA 47 (L) 07/21/2018   Lipid Panel: No results found for: LDLCALC HgbA1c: No results found for: HGBA1C Urine Drug Screen: No results found for: LABOPIA, COCAINSCRNUR, LABBENZ, AMPHETMU, THCU, LABBARB  Alcohol Level No results found for: [place]  CT Head without contrast: Personally reviewed and CTH was negative for a large hypodensity concerning for a large territory infarct or hyperdensity concerning for an ICH  CT angio Head and Neck with contrast:(personally reviewed) No LVO, notable for atherosclerosis.  MRI Brain:(Personally reviewed) No acute stroke on my review.  Impression   Adrian Daniels is a 86 y.o. male with PMH significant for ortic regurgitation, atrial fibrillation on Eliquis, CKD stage III, prior history of strokes, hypertension, hyperlipidemia, history of bleeding who presents with  left-sided numbness.  He is on Eliquis and thus not a candidate for TNKase.  Symptoms are really mild to offer him thrombectomy.  I suspect this is likely lacunar stroke.  However, he does have extensive history of bleeding so the risk of adding an antiplatelet agent to his current anticoagulation is probably more risky than beneficial at this time.  Recommendations  - MRI Brain without contrast. - CTA head and Neck. - LDL, HbA1c. - If LDL > 70, would recommend changing him to atorvastatin from rosuvastatin. - HbA1c, if elevated, recommend better glucose control. - Echo is unlikely to change my management since he is already on maximal medical therapy from a cardiac standpoint. - PT and OT. - Can go back to his facility after MRI Brain. ______________________________________________________________________  Plan discussed with Dr. Godfrey Pick.  Update: - reviewed all the imaging and workup. LDL and HbA1c is negative. He can be discharged back to his facility. Given prior hx of GI bleeding, would not add Aspirin or other antiplatelet agent at this time. We will signoff. Follow up with neurology in stroke clinic.  Dr. Doren Custard updated via secure chat.   Thank you for the opportunity to take part in the care of this patient. If you have any further questions, please contact the neurology consultation  attending.  Signed,  [place] Pager Number [phone] _ _ _   _ __   _ __ _ _  __ __   _ __   __ _

## 2021-03-16 ENCOUNTER — Ambulatory Visit (HOSPITAL_COMMUNITY): Admission: RE | Admit: 2021-03-16 | Payer: Medicare Other | Source: Home / Self Care | Admitting: General Surgery

## 2021-03-16 ENCOUNTER — Encounter (HOSPITAL_COMMUNITY): Admission: RE | Payer: Self-pay | Source: Home / Self Care

## 2021-03-16 SURGERY — REPAIR, HERNIA, INGUINAL, LAPAROSCOPIC
Anesthesia: General | Laterality: Right

## 2021-03-16 NOTE — Progress Notes (Signed)
Procedure canceled today by Dr. Doroteo Glassman due to patient being in hospital over weekend with stroke like symptoms.  Patient and daughter had arrived to the hospital but was not checked in by admitting.  Dr. Doroteo Glassman and nurse went to speak to patient in admitting.  Patient and daughter provided the AVS from the weekend and also given stroke center number.  Patient's daughter stated that they were not told to follow-up with neurologist or stroke center.  Daughter stated she will get the appointment made and will then follow up with Dr. Rosendo Gros.    Dr. Rosendo Gros and OR desk aware.

## 2021-03-23 DIAGNOSIS — M545 Low back pain, unspecified: Secondary | ICD-10-CM | POA: Diagnosis not present

## 2021-03-23 DIAGNOSIS — K4090 Unilateral inguinal hernia, without obstruction or gangrene, not specified as recurrent: Secondary | ICD-10-CM | POA: Diagnosis not present

## 2021-03-23 DIAGNOSIS — N401 Enlarged prostate with lower urinary tract symptoms: Secondary | ICD-10-CM | POA: Diagnosis not present

## 2021-03-23 DIAGNOSIS — K5901 Slow transit constipation: Secondary | ICD-10-CM | POA: Diagnosis not present

## 2021-03-23 DIAGNOSIS — R634 Abnormal weight loss: Secondary | ICD-10-CM | POA: Diagnosis not present

## 2021-03-23 DIAGNOSIS — I482 Chronic atrial fibrillation, unspecified: Secondary | ICD-10-CM | POA: Diagnosis not present

## 2021-03-31 DIAGNOSIS — R351 Nocturia: Secondary | ICD-10-CM | POA: Diagnosis not present

## 2021-03-31 DIAGNOSIS — N401 Enlarged prostate with lower urinary tract symptoms: Secondary | ICD-10-CM | POA: Diagnosis not present

## 2021-04-01 ENCOUNTER — Other Ambulatory Visit: Payer: Self-pay | Admitting: Neurology

## 2021-04-01 DIAGNOSIS — I63311 Cerebral infarction due to thrombosis of right middle cerebral artery: Secondary | ICD-10-CM

## 2021-04-01 DIAGNOSIS — K4091 Unilateral inguinal hernia, without obstruction or gangrene, recurrent: Secondary | ICD-10-CM | POA: Diagnosis not present

## 2021-04-13 ENCOUNTER — Ambulatory Visit (INDEPENDENT_AMBULATORY_CARE_PROVIDER_SITE_OTHER): Payer: Medicare Other | Admitting: Neurology

## 2021-04-13 ENCOUNTER — Encounter: Payer: Self-pay | Admitting: Neurology

## 2021-04-13 VITALS — BP 146/91 | HR 78 | Ht 71.0 in | Wt 166.8 lb

## 2021-04-13 DIAGNOSIS — R2990 Unspecified symptoms and signs involving the nervous system: Secondary | ICD-10-CM

## 2021-04-13 DIAGNOSIS — R200 Anesthesia of skin: Secondary | ICD-10-CM

## 2021-04-13 NOTE — Progress Notes (Signed)
[facility] [address] [place]. [place] [address] [phone]       OFFICE CONSULT NOTE  [name] Date of Birth:  1934/08/30 Medical Record Number:  [id number]   Referring MD: Rosalin Hawking  Reason for Referral: Stroke  HPI: Adrian Daniels is a 86 year old African-American male seen today for initial office consultation visit for stroke.  History is obtained from the patient, review of electronic medical records and opossum reviewed pertinent available imaging films in PACS.  He has past medical history of atrial fibrillation on long-term Eliquis, aortic regurgitation, chronic kidney disease stage III, prior strokes, hypertension, hyperlipidemia who presented on 03/13/2021 with sudden onset left-sided numbness upon awakening from sleep.  He was normal the night prior at 10:00 when he went to sleep.  He described tingling and numbness involving his fingertips as well as left leg from the knee down.  The symptoms persisted.  CT scan of the head was unremarkable and MRI scan showed no acute stroke but only changes of small vessel disease.  CT scan suggested possible subacute left basal ganglia lacune but MRI did not confirm it.  CT angiogram of the brain showed no significant large vessel stenosis or occlusion.  LDL cholesterol was 59 mg percent and hemoglobin A1c was 5.7.  2D echo was not done but 1 in March 2022 had shown ejection fraction of 55 to 60% with no wall motion abnormalities.  Patient was asked to continue his Eliquis and discharged home.  He states is done well since discharge and the numbness is improved but has not completely gone away still has some numbness in the tips of his fingers and toes but it is improving.  He is on Eliquis tolerating it well with minor bruising but no bleeding.  He is blood pressure is well controlled today it is slightly elevated in office at 146/91.  Patient has remote history of stroke in 2007 from which he made full recovery.  He  cannot remember his deficits.  He has no new complaints today.  He is tolerating Crestor well without muscle aches and pains. ROS:   14 system review of systems is positive for numbness, tingling, bruising all other systems negative  PMH:  Past Medical History:  Diagnosis Date   Anemia, unspecified    Aortic regurgitation    Aortic root dilation (HCC)    Atrial fibrillation (HCC)    Atypical chest pain 06/30/2018   CKD (chronic kidney disease) stage 3, GFR 30-59 ml/min ([place]) 06/30/2018   Dysrhythmia    Essential hypertension 06/30/2018   H/O TIA (transient ischemic attack) and stroke 06/30/2005   Hemorrhoids    History of colon polyps 05/28/2007   Adenomatous polyp(Colonoscopy with Dr. Lajoyce Corners)   Hx of adenomatous colonic polyps 02/24/2003   2004,2006,2009 Dr. Lajoyce Corners   Hyperlipemia    Hypertension    Internal hemorrhoids, bleeding and Grade 3 prolapse 12/09/2014   Wears dentures     Social History:  Social History   Socioeconomic History   Marital status: Single    Spouse name: Not on file   Number of children: 7   Years of education: Not on file   Highest education level: Not on file  Occupational History   Not on file  Tobacco Use   Smoking status: Former    Packs/day: 0.50    Years: 30.00    Pack years: 15.00    Types: Cigarettes    Quit date: 07/08/1989    Years since quitting:  31.7   Smokeless tobacco: Never  Vaping Use   Vaping Use: Never used  Substance and Sexual Activity   Alcohol use: Yes    Alcohol/week: 4.0 standard drinks    Types: 4 Standard drinks or equivalent per week    Comment: 4 per week   Drug use: No   Sexual activity: Not on file  Other Topics Concern   Not on file  Social History Narrative   Not on file   Social Determinants of Health   Financial Resource Strain: Not on file  Food Insecurity: Not on file  Transportation Needs: Not on file  Physical Activity: Not on file  Stress: Not on file  Social Connections: Not on file  Intimate  Partner Violence: Not on file    Medications:   Current Outpatient Medications on File Prior to Visit  Medication Sig Dispense Refill   apixaban (ELIQUIS) 2.5 MG TABS tablet Take 1 tablet by mouth 2 (two) times daily.     desloratadine (CLARINEX) 5 MG tablet Take 1 tablet by mouth as needed.     diltiazem (CARDIZEM CD) 300 MG 24 hr capsule Take 300 mg by mouth daily.     ezetimibe (ZETIA) 10 MG tablet Take 10 mg by mouth daily.     losartan-hydrochlorothiazide (HYZAAR) 100-12.5 MG tablet Take 1 tablet by mouth daily.     rosuvastatin (CRESTOR) 20 MG tablet Take 1 tablet by mouth daily.     tamsulosin (FLOMAX) 0.4 MG CAPS capsule Take 0.4 mg by mouth daily.     Tetrahydrozoline HCl (VISINE OP) Place 1 drop into both eyes daily as needed (dry eyes).     No current facility-administered medications on file prior to visit.    Allergies:  No Known Allergies  Physical Exam General: Frail elderly African-American male, seated, in no evident distress Head: head normocephalic and atraumatic.   Neck: supple with no carotid or supraclavicular bruits Cardiovascular: regular rate and rhythm, no murmurs Musculoskeletal: no deformity Skin:  no rash/petichiae Vascular:  Normal pulses all extremities  Neurologic Exam Mental Status: Awake and fully alert. Oriented to place and time. Recent and remote memory intact. Attention span, concentration and fund of knowledge appropriate. Mood and affect appropriate.  Cranial Nerves: Fundoscopic exam reveals sharp disc margins. Pupils equal, briskly reactive to light. Extraocular movements full without nystagmus. Visual fields full to confrontation. Hearing intact. Facial sensation intact. Face, tongue, palate moves normally and symmetrically.  Motor: Normal bulk and tone. Normal strength in all tested extremity muscles. Sensory.: intact to touch , pinprick , position and vibratory sensation.  Coordination: Rapid alternating movements normal in all  extremities. Finger-to-nose and heel-to-shin performed accurately bilaterally. Gait and Station: Arises from chair without difficulty. Stance is normal. Gait demonstrates normal stride length and balance . Able to heel, toe and tandem walk with moderate difficulty.  Reflexes: 1+ and symmetric. Toes downgoing.   NIHSS  0 Modified Rankin  2   ASSESSMENT: 86 year old African-American male with episode of left-sided numbness in October 2022 likely due to small right subcortical infarct not visualized on MRI from small vessel disease.  Vascular risk factors of atrial fibrillation, hypertension, hyperlipidemia, remote stroke and age.    PLAN:I had a long d/w patient about his recent stroke like episode and numbness, risk for recurrent stroke/TIAs, personally independently reviewed imaging studies and stroke evaluation results and answered questions.Continue Eliquis (apixaban) 5 mg twice daily  for secondary stroke prevention  for atrial Fibrillation and maintain strict control of hypertension with  blood pressure goal below 130/90, diabetes with hemoglobin A1c goal below 6.5% and lipids with LDL cholesterol goal below 70 mg/dL. I also advised the patient to eat a healthy diet with plenty of whole grains, cereals, fruits and vegetables, exercise regularly and maintain ideal body weight Followup in the future with my nurse practitioner in 3 months or call earlier if necessary.  Greater than 50% time during this 45-minute consultation visit were spent in counseling and coordination of care about his episode of numbness and stroke and discussion about stroke prevention and treatment and answering questions.  Antony Contras, MD Note: This document was prepared with digital dictation and possible smart phrase technology. Any transcriptional errors that result from this process are unintentional.

## 2021-04-13 NOTE — Patient Instructions (Signed)
I had a long d/w patient about his recent stroke like episode and numbness, risk for recurrent stroke/TIAs, personally independently reviewed imaging studies and stroke evaluation results and answered questions.Continue Eliquis (apixaban) 5 mg twice daily  for secondary stroke prevention  for atrial Fibrillation and maintain strict control of hypertension with blood pressure goal below 130/90, diabetes with hemoglobin A1c goal below 6.5% and lipids with LDL cholesterol goal below 70 mg/dL. I also advised the patient to eat a healthy diet with plenty of whole grains, cereals, fruits and vegetables, exercise regularly and maintain ideal body weight Followup in the future with my nurse practitioner in 3 months or call earlier if necessary. Stroke Prevention Some medical conditions and behaviors can lead to a higher chance of having a stroke. You can help prevent a stroke by eating healthy, exercising, not smoking, and managing any medical conditions you have. Stroke is a leading cause of functional impairment. Primary prevention is particularly important because a majority of strokes are first-time events. Stroke changes the lives of not only those who experience a stroke but also their family and other caregivers. How can this condition affect me? A stroke is a medical emergency and should be treated right away. A stroke can lead to brain damage and can sometimes be life-threatening. If a person gets medical treatment right away, there is a better chance of surviving and recovering from a stroke. What can increase my risk? The following medical conditions may increase your risk of a stroke: Cardiovascular disease. High blood pressure (hypertension). Diabetes. High cholesterol. Sickle cell disease. Blood clotting disorders (hypercoagulable state). Obesity. Sleep disorders (obstructive sleep apnea). Other risk factors include: Being older than age 64. Having a history of blood clots, stroke, or  mini-stroke (transient ischemic attack, TIA). Genetic factors, such as race, ethnicity, or a family history of stroke. Smoking cigarettes or using other tobacco products. Taking birth control pills, especially if you also use tobacco. Heavy use of alcohol or drugs, especially cocaine and methamphetamine. Physical inactivity. What actions can I take to prevent this? Manage your health conditions High cholesterol levels. Eating a healthy diet is important for preventing high cholesterol. If cholesterol cannot be managed through diet alone, you may need to take medicines. Take any prescribed medicines to control your cholesterol as told by your health care provider. Hypertension. To reduce your risk of stroke, try to keep your blood pressure below 130/80. Eating a healthy diet and exercising regularly are important for controlling blood pressure. If these steps are not enough to manage your blood pressure, you may need to take medicines. Take any prescribed medicines to control hypertension as told by your health care provider. Ask your health care provider if you should monitor your blood pressure at home. Have your blood pressure checked every year, even if your blood pressure is normal. Blood pressure increases with age and some medical conditions. Diabetes. Eating a healthy diet and exercising regularly are important parts of managing your blood sugar (glucose). If your blood sugar cannot be managed through diet and exercise, you may need to take medicines. Take any prescribed medicines to control your diabetes as told by your health care provider. Get evaluated for obstructive sleep apnea. Talk to your health care provider about getting a sleep evaluation if you snore a lot or have excessive sleepiness. Make sure that any other medical conditions you have, such as atrial fibrillation or atherosclerosis, are managed. Nutrition Follow instructions from your health care provider about what to  eat or  drink to help manage your health condition. These instructions may include: Reducing your daily calorie intake. Limiting how much salt (sodium) you use to 1,500 milligrams (mg) each day. Using only healthy fats for cooking, such as olive oil, canola oil, or sunflower oil. Eating healthy foods. You can do this by: Choosing foods that are high in fiber, such as whole grains, and fresh fruits and vegetables. Eating at least 5 servings of fruits and vegetables a day. Try to fill one-half of your plate with fruits and vegetables at each meal. Choosing lean protein foods, such as lean cuts of meat, poultry without skin, fish, tofu, beans, and nuts. Eating low-fat dairy products. Avoiding foods that are high in sodium. This can help lower blood pressure. Avoiding foods that have saturated fat, trans fat, and cholesterol. This can help prevent high cholesterol. Avoiding processed and prepared foods. Counting your daily carbohydrate intake.  Lifestyle If you drink alcohol: Limit how much you have to: 0-1 drink a day for women who are not pregnant. 0-2 drinks a day for men. Know how much alcohol is in your drink. In the [place] one drink equals one 12 oz bottle of beer (310m), one 5 oz glass of wine (1436m, or one 1 oz glass of hard liquor (4479m Do not use any products that contain nicotine or tobacco. These products include cigarettes, chewing tobacco, and vaping devices, such as e-cigarettes. If you need help quitting, ask your health care provider. Avoid secondhand smoke. Do not use drugs. Activity  Try to stay at a healthy weight. Get at least 30 minutes of exercise on most days, such as: Fast walking. Biking. Swimming. Medicines Take over-the-counter and prescription medicines only as told by your health care provider. Aspirin or blood thinners (antiplatelets or anticoagulants) may be recommended to reduce your risk of forming blood clots that can lead to stroke. Avoid taking  birth control pills. Talk to your health care provider about the risks of taking birth control pills if: You are over 35 29ars old. You smoke. You get very bad headaches. You have had a blood clot. Where to find more information American Stroke Association: www.strokeassociation.org Get help right away if: You or a loved one has any symptoms of a stroke. "BE FAST" is an easy way to remember the main warning signs of a stroke: B - Balance. Signs are dizziness, sudden trouble walking, or loss of balance. E - Eyes. Signs are trouble seeing or a sudden change in vision. F - Face. Signs are sudden weakness or numbness of the face, or the face or eyelid drooping on one side. A - Arms. Signs are weakness or numbness in an arm. This happens suddenly and usually on one side of the body. S - Speech. Signs are sudden trouble speaking, slurred speech, or trouble understanding what people say. T - Time. Time to call emergency services. Write down what time symptoms started. You or a loved one has other signs of a stroke, such as: A sudden, severe headache with no known cause. Nausea or vomiting. Seizure. These symptoms may represent a serious problem that is an emergency. Do not wait to see if the symptoms will go away. Get medical help right away. Call your local emergency services (911 in the [place] Do not drive yourself to the hospital. Summary You can help to prevent a stroke by eating healthy, exercising, not smoking, limiting alcohol intake, and managing any medical conditions you may have. Do not use any products that contain nicotine  or tobacco. These include cigarettes, chewing tobacco, and vaping devices, such as e-cigarettes. If you need help quitting, ask your health care provider. Remember "BE FAST" for warning signs of a stroke. Get help right away if you or a loved one has any of these signs. This information is not intended to replace advice given to you by your health care provider. Make  sure you discuss any questions you have with your health care provider. Document Revised: 12/01/2019 Document Reviewed: 12/01/2019 Elsevier Patient Education  [place].

## 2021-05-20 DIAGNOSIS — E7801 Familial hypercholesterolemia: Secondary | ICD-10-CM | POA: Diagnosis not present

## 2021-05-20 DIAGNOSIS — K921 Melena: Secondary | ICD-10-CM | POA: Diagnosis not present

## 2021-05-20 DIAGNOSIS — I10 Essential (primary) hypertension: Secondary | ICD-10-CM | POA: Diagnosis not present

## 2021-05-23 DIAGNOSIS — N529 Male erectile dysfunction, unspecified: Secondary | ICD-10-CM | POA: Diagnosis not present

## 2021-05-23 DIAGNOSIS — D649 Anemia, unspecified: Secondary | ICD-10-CM | POA: Diagnosis not present

## 2021-05-23 DIAGNOSIS — K5901 Slow transit constipation: Secondary | ICD-10-CM | POA: Diagnosis not present

## 2021-05-23 DIAGNOSIS — N401 Enlarged prostate with lower urinary tract symptoms: Secondary | ICD-10-CM | POA: Diagnosis not present

## 2021-05-23 DIAGNOSIS — Z0000 Encounter for general adult medical examination without abnormal findings: Secondary | ICD-10-CM | POA: Diagnosis not present

## 2021-05-23 DIAGNOSIS — D72819 Decreased white blood cell count, unspecified: Secondary | ICD-10-CM | POA: Diagnosis not present

## 2021-05-23 DIAGNOSIS — I482 Chronic atrial fibrillation, unspecified: Secondary | ICD-10-CM | POA: Diagnosis not present

## 2021-05-23 DIAGNOSIS — I10 Essential (primary) hypertension: Secondary | ICD-10-CM | POA: Diagnosis not present

## 2021-05-23 DIAGNOSIS — E038 Other specified hypothyroidism: Secondary | ICD-10-CM | POA: Diagnosis not present

## 2021-05-23 DIAGNOSIS — D6869 Other thrombophilia: Secondary | ICD-10-CM | POA: Diagnosis not present

## 2021-05-23 DIAGNOSIS — K921 Melena: Secondary | ICD-10-CM | POA: Diagnosis not present

## 2021-05-23 DIAGNOSIS — N1832 Chronic kidney disease, stage 3b: Secondary | ICD-10-CM | POA: Diagnosis not present

## 2021-05-26 DIAGNOSIS — H16143 Punctate keratitis, bilateral: Secondary | ICD-10-CM | POA: Diagnosis not present

## 2021-05-26 DIAGNOSIS — H02834 Dermatochalasis of left upper eyelid: Secondary | ICD-10-CM | POA: Diagnosis not present

## 2021-05-26 DIAGNOSIS — H02831 Dermatochalasis of right upper eyelid: Secondary | ICD-10-CM | POA: Diagnosis not present

## 2021-05-26 DIAGNOSIS — H04123 Dry eye syndrome of bilateral lacrimal glands: Secondary | ICD-10-CM | POA: Diagnosis not present

## 2021-05-26 DIAGNOSIS — H0288A Meibomian gland dysfunction right eye, upper and lower eyelids: Secondary | ICD-10-CM | POA: Diagnosis not present

## 2021-05-26 DIAGNOSIS — H0288B Meibomian gland dysfunction left eye, upper and lower eyelids: Secondary | ICD-10-CM | POA: Diagnosis not present

## 2021-05-26 DIAGNOSIS — H0102B Squamous blepharitis left eye, upper and lower eyelids: Secondary | ICD-10-CM | POA: Diagnosis not present

## 2021-05-26 DIAGNOSIS — H0102A Squamous blepharitis right eye, upper and lower eyelids: Secondary | ICD-10-CM | POA: Diagnosis not present

## 2021-05-26 DIAGNOSIS — H2513 Age-related nuclear cataract, bilateral: Secondary | ICD-10-CM | POA: Diagnosis not present

## 2021-06-07 DIAGNOSIS — H16143 Punctate keratitis, bilateral: Secondary | ICD-10-CM | POA: Diagnosis not present

## 2021-06-07 DIAGNOSIS — H0102A Squamous blepharitis right eye, upper and lower eyelids: Secondary | ICD-10-CM | POA: Diagnosis not present

## 2021-06-07 DIAGNOSIS — H2513 Age-related nuclear cataract, bilateral: Secondary | ICD-10-CM | POA: Diagnosis not present

## 2021-06-07 DIAGNOSIS — H0102B Squamous blepharitis left eye, upper and lower eyelids: Secondary | ICD-10-CM | POA: Diagnosis not present

## 2021-06-07 DIAGNOSIS — H04123 Dry eye syndrome of bilateral lacrimal glands: Secondary | ICD-10-CM | POA: Diagnosis not present

## 2021-06-07 DIAGNOSIS — H0288B Meibomian gland dysfunction left eye, upper and lower eyelids: Secondary | ICD-10-CM | POA: Diagnosis not present

## 2021-06-07 DIAGNOSIS — H0288A Meibomian gland dysfunction right eye, upper and lower eyelids: Secondary | ICD-10-CM | POA: Diagnosis not present

## 2021-06-07 DIAGNOSIS — H02831 Dermatochalasis of right upper eyelid: Secondary | ICD-10-CM | POA: Diagnosis not present

## 2021-06-07 DIAGNOSIS — H02834 Dermatochalasis of left upper eyelid: Secondary | ICD-10-CM | POA: Diagnosis not present

## 2021-06-07 DIAGNOSIS — D649 Anemia, unspecified: Secondary | ICD-10-CM | POA: Diagnosis not present

## 2021-07-13 ENCOUNTER — Other Ambulatory Visit: Payer: Self-pay

## 2021-07-13 ENCOUNTER — Ambulatory Visit (INDEPENDENT_AMBULATORY_CARE_PROVIDER_SITE_OTHER): Payer: Medicare Other | Admitting: Adult Health

## 2021-07-13 ENCOUNTER — Encounter: Payer: Self-pay | Admitting: Adult Health

## 2021-07-13 VITALS — BP 144/87 | HR 70 | Ht 71.0 in | Wt 166.0 lb

## 2021-07-13 DIAGNOSIS — R2990 Unspecified symptoms and signs involving the nervous system: Secondary | ICD-10-CM

## 2021-07-13 NOTE — Patient Instructions (Signed)
Continue Eliquis (apixaban) daily  and Crestor and Zetia for secondary stroke prevention ? ?Continue to follow with cardiology for atrial fibrillation and Eliquis management ? ?Continue to follow up with PCP regarding cholesterol and blood pressure management  ?Maintain strict control of hypertension with blood pressure goal below 130/90 and cholesterol with LDL cholesterol (bad cholesterol) goal below 70 mg/dL.  ? ?Signs of a Stroke? Follow the BEFAST method:  ?Balance Watch for a sudden loss of balance, trouble with coordination or vertigo ?Eyes Is there a sudden loss of vision in one or both eyes? Or double vision?  ?Face: Ask the person to smile. Does one side of the face droop or is it numb?  ?Arms: Ask the person to raise both arms. Does one arm drift downward? Is there weakness or numbness of a leg? ?Speech: Ask the person to repeat a simple phrase. Does the speech sound slurred/strange? Is the person confused ? ?Time: If you observe any of these signs, call 911. ? ? ? ? ? ? ? ?Thank you for coming to see [place] at [facility]. I hope we have been able to provide you high quality care today. ? ?You may receive a patient satisfaction survey over the next few weeks. We would appreciate your feedback and comments so that we may continue to improve ourselves and the health of our patients. ? ?

## 2021-07-13 NOTE — Progress Notes (Signed)
[facility] [address] [place]. [place] [address] [phone]       OFFICE FOLLOW UP NOTE  [name] Date of Birth:  Aug 28, 1934 Medical Record Number:  [id number]    Primary neurologist: Dr. Leonie Man Reason for Referral: Stroke   Chief Complaint  Patient presents with   Follow-up    RM 3 Pt is well and stable, states numbness has improved.       HPI:   Update 07/13/2021 JM: 87 year old male with history of right subcortical stroke not visualized on MRI in 02/2021.  Overall stable from stroke standpoint without new or reoccurring stroke/TIA symptoms. Denies any residual left sided symptoms. Compliant on Eliquis, Crestor and Zetia, denies side effects.  Blood pressure today 144/87.  Recent follow-up with PCP with completion of lab work.  Has follow-up visit with cardiologist Dr. Einar Gip 3/6. He does question proceeding with laparoscopic right inguinal hernia repair which was previously placed on hold in setting of stroke.  No new concerns at this time.    History provided for reference purposes only Consult visit 04/13/2021 Dr. Leonie Man: Adrian Daniels is a 87 year old African-American male seen today for initial office consultation visit for stroke.  History is obtained from the patient, review of electronic medical records and opossum reviewed pertinent available imaging films in PACS.  He has past medical history of atrial fibrillation on long-term Eliquis, aortic regurgitation, chronic kidney disease stage III, prior strokes, hypertension, hyperlipidemia who presented on 03/13/2021 with sudden onset left-sided numbness upon awakening from sleep.  He was normal the night prior at 10:00 when he went to sleep.  He described tingling and numbness involving his fingertips as well as left leg from the knee down.  The symptoms persisted.  CT scan of the head was unremarkable and MRI scan showed no acute stroke but only changes of small vessel disease.  CT scan suggested  possible subacute left basal ganglia lacune but MRI did not confirm it.  CT angiogram of the brain showed no significant large vessel stenosis or occlusion.  LDL cholesterol was 59 mg percent and hemoglobin A1c was 5.7.  2D echo was not done but 1 in March 2022 had shown ejection fraction of 55 to 60% with no wall motion abnormalities.  Patient was asked to continue his Eliquis and discharged home.  He states is done well since discharge and the numbness is improved but has not completely gone away still has some numbness in the tips of his fingers and toes but it is improving.  He is on Eliquis tolerating it well with minor bruising but no bleeding.  He is blood pressure is well controlled today it is slightly elevated in office at 146/91.  Patient has remote history of stroke in 2007 from which he made full recovery.  He cannot remember his deficits.  He has no new complaints today.  He is tolerating Crestor well without muscle aches and pains.     ROS:   14 system review of systems is positive for numbness and all other systems negative  PMH:  Past Medical History:  Diagnosis Date   Anemia, unspecified    Aortic regurgitation    Aortic root dilation (HCC)    Atrial fibrillation (HCC)    Atypical chest pain 06/30/2018   CKD (chronic kidney disease) stage 3, GFR 30-59 ml/min ([place]) 06/30/2018   Dysrhythmia    Essential hypertension 06/30/2018   H/O TIA (transient ischemic attack) and stroke 06/30/2005   Hemorrhoids  History of colon polyps 05/28/2007   Adenomatous polyp(Colonoscopy with Dr. Lajoyce Corners)   Hx of adenomatous colonic polyps 02/24/2003   2004,2006,2009 Dr. Lajoyce Corners   Hyperlipemia    Hypertension    Internal hemorrhoids, bleeding and Grade 3 prolapse 12/09/2014   Wears dentures     Social History:  Social History   Socioeconomic History   Marital status: Single    Spouse name: Not on file   Number of children: 7   Years of education: Not on file   Highest education level: Not  on file  Occupational History   Not on file  Tobacco Use   Smoking status: Former    Packs/day: 0.50    Years: 30.00    Pack years: 15.00    Types: Cigarettes    Quit date: 07/08/1989    Years since quitting: 32.0   Smokeless tobacco: Never  Vaping Use   Vaping Use: Never used  Substance and Sexual Activity   Alcohol use: Yes    Alcohol/week: 4.0 standard drinks    Types: 4 Standard drinks or equivalent per week    Comment: 4 per week   Drug use: No   Sexual activity: Not on file  Other Topics Concern   Not on file  Social History Narrative   Not on file   Social Determinants of Health   Financial Resource Strain: Not on file  Food Insecurity: Not on file  Transportation Needs: Not on file  Physical Activity: Not on file  Stress: Not on file  Social Connections: Not on file  Intimate Partner Violence: Not on file    Medications:   Current Outpatient Medications on File Prior to Visit  Medication Sig Dispense Refill   apixaban (ELIQUIS) 2.5 MG TABS tablet Take 1 tablet by mouth 2 (two) times daily.     desloratadine (CLARINEX) 5 MG tablet Take 1 tablet by mouth as needed.     diltiazem (CARDIZEM CD) 300 MG 24 hr capsule Take 300 mg by mouth daily.     ezetimibe (ZETIA) 10 MG tablet Take 10 mg by mouth daily.     losartan-hydrochlorothiazide (HYZAAR) 100-12.5 MG tablet Take 1 tablet by mouth daily.     rosuvastatin (CRESTOR) 20 MG tablet Take 1 tablet by mouth daily.     tamsulosin (FLOMAX) 0.4 MG CAPS capsule Take 0.4 mg by mouth daily.     Tetrahydrozoline HCl (VISINE OP) Place 1 drop into both eyes daily as needed (dry eyes).     No current facility-administered medications on file prior to visit.    Allergies:  No Known Allergies  Physical Exam Today's Vitals   07/13/21 1431  BP: (!) 144/87  Pulse: 70  Weight: 166 lb (75.3 kg)  Height: 5\' 11"  (1.803 m)   Body mass index is 23.15 kg/m.   General: Frail very pleasant elderly African-American male,  seated, in no evident distress Head: head normocephalic and atraumatic.   Neck: supple with no carotid or supraclavicular bruits Cardiovascular: regular rate and rhythm, no murmurs Musculoskeletal: no deformity Skin:  no rash/petichiae Vascular:  Normal pulses all extremities  Neurologic Exam Mental Status: Awake and fully alert.  Fluent speech and language.  Oriented to place and time. Recent memory mildly impaired and remote memory intact. Attention span, concentration and fund of knowledge appropriate. Mood and affect appropriate.  Cranial Nerves: Pupils equal, briskly reactive to light. Extraocular movements full without nystagmus. Visual fields full to confrontation. Hearing intact. Facial sensation intact. Face, tongue, palate moves  normally and symmetrically.  Motor: Normal bulk and tone. Normal strength in all tested extremity muscles. Sensory.: intact to touch , pinprick , position and vibratory sensation.  Coordination: Rapid alternating movements normal in all extremities. Finger-to-nose and heel-to-shin performed accurately bilaterally. Gait and Station: Arises from chair without difficulty. Stance is normal. Gait demonstrates normal stride length and balance without use of assistive device. Able to heel, toe and tandem walk with moderate difficulty.  Reflexes: 1+ and symmetric. Toes downgoing.        ASSESSMENT/PLAN: 87 year old African-American male with episode of left-sided numbness in October 2022 likely due to small right subcortical infarct not visualized on MRI from small vessel disease.  Vascular risk factors of atrial fibrillation, hypertension, hyperlipidemia, remote stroke and age.    -Continue Eliquis (apixaban) 5 mg twice daily for secondary stroke prevention and atrial Fibrillation -Continue Crestor 20 mg daily and Zetia 10 mg daily for secondary stroke prevention measures -Medications managed and refilled by PCP/cardiology -Ensure close PCP follow-up for  aggressive stroke risk factor management: Maintain strict control of hypertension with blood pressure goal below 130/90, diabetes with hemoglobin A1c goal below 6.5% and lipids with LDL cholesterol goal below 70 mg/dL.  -Stroke labs: LDL 64 (05/2021), A1c 5.7 (02/2021) -he questions proceeding with surgical procedure for inguinal hernia. His stroke occurred 5 months ago, by the time surgery will likely be scheduled just about the 6 month mark if not after. At this time, he can proceed with surgical procedure with small but acceptable risk of recurrent stroke while of [facility] therapy. Advised to f/u with Dr. Einar Gip as scheduled to further discuss holding Eliquis and duration prior to surgery. Would recommend restarting AC immediately after procedure or once hemodynamically stable    Doing well from stroke standpoint and risk factors are managed by PCP. She may follow up PRN, as usual for our patients who are strictly being followed for stroke. If any new neurological issues should arise, request PCP place referral for evaluation by one of our neurologists. Thank you.     CC:  [name], MD   I spent 32 minutes of face-to-face and non-face-to-face time with patient.  This included previsit chart review, lab review, study review, electronic health record documentation, patient discussion regarding history of prior stroke and etiology and pertinent education, secondary stroke prevention measures and aggressive stroke risk factor management, surgical clearance as noted above and answered all other questions to patient satisfaction  Frann Rider, AGNP-BC  [facility] [address] [place], [place] [address]  Phone [phone] Fax [phone] Note: This document was prepared with digital dictation and possible smart phrase technology. Any transcriptional errors that result from this process are unintentional.

## 2021-07-18 ENCOUNTER — Ambulatory Visit: Payer: Medicare Other | Admitting: Cardiology

## 2021-07-18 ENCOUNTER — Encounter: Payer: Self-pay | Admitting: Cardiology

## 2021-07-18 ENCOUNTER — Other Ambulatory Visit: Payer: Self-pay

## 2021-07-18 VITALS — BP 135/67 | HR 76 | Temp 98.6°F | Resp 16 | Ht 71.0 in | Wt 165.0 lb

## 2021-07-18 DIAGNOSIS — I77810 Thoracic aortic ectasia: Secondary | ICD-10-CM | POA: Diagnosis not present

## 2021-07-18 DIAGNOSIS — I10 Essential (primary) hypertension: Secondary | ICD-10-CM

## 2021-07-18 DIAGNOSIS — R2990 Unspecified symptoms and signs involving the nervous system: Secondary | ICD-10-CM | POA: Diagnosis not present

## 2021-07-18 DIAGNOSIS — I4821 Permanent atrial fibrillation: Secondary | ICD-10-CM | POA: Diagnosis not present

## 2021-07-18 NOTE — Progress Notes (Signed)
Primary Physician/Referring:  [name], MD  Patient ID: Adrian Daniels, male    DOB: 09-22-1934, 87 y.o.   MRN: [id number]  Chief Complaint  Patient presents with   Atrial Fibrillation   Hypertension   Aortic root dilatation   Follow-up    1 year   HPI:    Adrian Daniels  is a 87 y.o. AAM patient with  history of chronic atrial fibrillation, TIA and stroke in 2007, aortic root dilatation, hypertension, chronic kidney disease stage IIIb, hyperlipidemia.   Patient is on Eliquis and tolerating this. He has noticed recent worsening constipation and occasional dark stools.  This is annual visit.  Otherwise continues to remain active, accompanied by his granddaughter.  Denies chest pain, dyspnea, leg edema, PND or orthopnea.  Past Medical History:  Diagnosis Date   Anemia, unspecified    Aortic regurgitation    Aortic root dilation (HCC)    Atrial fibrillation (HCC)    Atypical chest pain 06/30/2018   CKD (chronic kidney disease) stage 3, GFR 30-59 ml/min ([place]) 06/30/2018   Dysrhythmia    Essential hypertension 06/30/2018   H/O TIA (transient ischemic attack) and stroke 06/30/2005   Hemorrhoids    History of colon polyps 05/28/2007   Adenomatous polyp(Colonoscopy with Dr. Lajoyce Corners)   Hx of adenomatous colonic polyps 02/24/2003   2004,2006,2009 Dr. Lajoyce Corners   Hyperlipemia    Hypertension    Internal hemorrhoids, bleeding and Grade 3 prolapse 12/09/2014   Wears dentures    Social History   Tobacco Use   Smoking status: Former    Packs/day: 0.50    Years: 30.00    Pack years: 15.00    Types: Cigarettes    Quit date: 07/08/1989    Years since quitting: 32.0   Smokeless tobacco: Never  Substance Use Topics   Alcohol use: Yes    Alcohol/week: 4.0 standard drinks    Types: 4 Standard drinks or equivalent per week    Comment: 4 per week   ROS  Review of Systems  Cardiovascular:  Negative for chest pain, dyspnea on exertion and leg swelling.  Gastrointestinal:  Positive for  melena. Negative for heartburn.  Objective  Blood pressure 135/67, pulse 76, temperature 98.6 F (37 C), temperature source Temporal, resp. rate 16, height 5' 11"  (1.803 m), weight 165 lb (74.8 kg), SpO2 97 %.  Vitals with BMI 07/18/2021 07/13/2021 04/13/2021  Height 5' 11"  5' 11"  5' 11"   Weight 165 lbs 166 lbs 166 lbs 13 oz  BMI 23.02 03.47 42.59  Systolic 563 875 643  Diastolic 67 87 91  Pulse 76 70 78     Physical Exam Cardiovascular:     Rate and Rhythm: Normal rate. Rhythm irregular.     Pulses: Normal pulses and intact distal pulses.     Heart sounds: Murmur heard.  Blowing early diastolic murmur is present with a grade of 2/4 at the upper right sternal border and lower left sternal border radiating to the apex.    No gallop. No S3 or S4 sounds.  Pulmonary:     Effort: Pulmonary effort is normal.     Breath sounds: Normal breath sounds.  Abdominal:     General: Bowel sounds are normal.     Palpations: Abdomen is soft.   Laboratory examination:    CBC Latest Ref Rng & Units 03/13/2021 03/13/2021 03/09/2021  WBC 4.0 - 10.5 K/uL - 3.7(L) 3.4(L)  Hemoglobin 13.0 - 17.0 g/dL 11.2(L) 11.0(L) 10.8(L)  Hematocrit 39.0 -  52.0 % 33.0(L) 32.1(L) 32.1(L)  Platelets 150 - 400 K/uL - 128(L) 134(L)    External labs :   Labs 05/20/2021:  Total cholesterol 136, triglycerides 39, HDL 64, LDL 64.  TSH normal at 4.26.  Hb 10.9/HCT 31.4, platelets 153.  Mild macrocytosis present.  Total iron 60, lower limit of normal (59-158).  TIBC 265 (250-150).  Sodium 144, potassium 3.6, BUN 21, creatinine 1.76, EGFR 40 mL.   Medications and allergies  No Known Allergies    Current Outpatient Medications:    apixaban (ELIQUIS) 2.5 MG TABS tablet, Take 1 tablet by mouth 2 (two) times daily., Disp: , Rfl:    desloratadine (CLARINEX) 5 MG tablet, Take 1 tablet by mouth as needed., Disp: , Rfl:    diltiazem (CARDIZEM CD) 300 MG 24 hr capsule, Take 300 mg by mouth daily., Disp: , Rfl:    ezetimibe  (ZETIA) 10 MG tablet, Take 10 mg by mouth daily., Disp: , Rfl:    losartan-hydrochlorothiazide (HYZAAR) 100-12.5 MG tablet, Take 1 tablet by mouth daily., Disp: , Rfl:    rosuvastatin (CRESTOR) 20 MG tablet, Take 1 tablet by mouth daily., Disp: , Rfl:    tamsulosin (FLOMAX) 0.4 MG CAPS capsule, Take 0.4 mg by mouth daily., Disp: , Rfl:    Tetrahydrozoline HCl (VISINE OP), Place 1 drop into both eyes daily as needed (dry eyes)., Disp: , Rfl:     Radiology:   CXR 07/20/2018:  Mild left basilar atelectasis. Mild cardiomegaly without pulmonary edema.  Cardiac Studies:   Lower extremity arterial duplex 01/27/2015: No hemodynamically significant stenoses are identified in the bilateral lower extremity arterial system.This exam reveals normal perfusion of both the lower extremities with RABI 1.03 and LABI 0.96.  Exercise myoview stress 06/05/2018:  1. The patient performed treadmill exercise using Bruce protocol, completing 5:07 minutes. The patient completed an estimated workload of 7 METS, reaching 92% of the maximum predicted heart rate. Exercise capacity was low.. Resting hypertension 142/106 mmHg, with exaggerated exercise response, peak BP reaching 220/80 mmHg. No stress symptoms reported. No ischemic changes seen on stress electrocardiogram.  2. The overall quality of the study is good. There is no evidence of abnormal lung activity. Stress and rest SPECT images demonstrate homogeneous tracer distribution throughout the myocardium. Gated SPECT imaging reveals normal myocardial thickening and wall motion. The left ventricular ejection fraction was calculated 48%, although visually appears normal.  3. Low risk study.  PCV ECHOCARDIOGRAM COMPLETE 07/29/2020  Narrative Echocardiogram 07/29/2020: Normal LV systolic function with visual EF 55-60%. Left ventricle cavity is normal in size. Mild left ventricular hypertrophy. Normal global wall motion. Unable to evaluate diastolic function due to  atrial fibrillation. Normal LAP. Left atrial cavity is mildly dilated. Aortic valve appears to be bicuspid with fusion of the RCC and LCC.   Eccentric closure on parasternal long axis view.  Moderate aortic regurgitation with an eccentric jet. Mild (Grade I) mitral regurgitation. Mild tricuspid regurgitation. 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 Previous study dated 07/21/2018: LVEF 55-60%, mild LVH, RVSP 46 mmHg, tricuspid aortic valve, mild AR, mild to moderate dilatation of the aortic root (sinus of Valsalva 42 mm, proximal ascending aorta 40 mm). Recommendation: Consider additional cardiovascular testing to further evaluate the aortic valve morphology and aorta dimensions.  EKG:    EKG 07/18/2021: Atrial fibrillation with controlled ventricular response at rate of 68 bpm.  Normal QT interval, no evidence of ischemia.  No significant change from  07/17/2020.   Assessment     ICD-10-CM   1. Permanent atrial fibrillation (HCC)  I48.21 EKG 12-Lead    PCV ECHOCARDIOGRAM COMPLETE    2. Essential hypertension  I10     3. Aortic root dilatation (HCC)  I77.810 PCV ECHOCARDIOGRAM COMPLETE      CHA2DS2-VASc Score is 5.  Yearly risk of stroke: 7.2% (A, HTN, CVA).  Score of 1=0.6; 2=2.2; 3=3.2; 4=4.8; 5=7.2; 6=9.8; 7=>9.8) -(CHF; HTN; vasc disease DM,  Male = 1; Age <65 =0; 65-74 = 1,  >75 =2; stroke/embolism= 2).   No orders of the defined types were placed in this encounter.   There are no discontinued medications.   Recommendations:   TUFF CLABO  is a 87 y.o. AAM patient with  history of chronic atrial fibrillation, TIA and stroke in 2007, aortic root dilatation, hypertension, chronic kidney disease stage IIIb, hyperlipidemia.   Patient is on Eliquis and tolerating this. He has noticed recent worsening constipation and occasional dark stools. I reviewed his external labs, serum creatinine is remained stable, blood counts are  stable, has chronic thrombocytopenia, iron levels appear to be lower limit of normal.  Due to underlying anemia, that is new in onset, he has been referred for GI work-up.  Lipids are well controlled, blood pressure is also well controlled.  Atrial fibrillation remains rate controlled.     He needs repeat echocardiogram to follow-up on aortic root dilatation and also aortic aneurysm if present, previously the ascending aorta showed 5 cm but could have been an error.  Unless echocardiogram confirms marked enlargement of the ascending aorta, I will see him back in a year otherwise he may need CT angiogram.     Adrian Prows, MD, [facility] 07/18/2021, 5:12 PM Office: [phone] Pager: [phone]

## 2021-07-19 ENCOUNTER — Other Ambulatory Visit: Payer: Self-pay | Admitting: Physician Assistant

## 2021-07-19 DIAGNOSIS — R634 Abnormal weight loss: Secondary | ICD-10-CM

## 2021-07-19 DIAGNOSIS — R109 Unspecified abdominal pain: Secondary | ICD-10-CM

## 2021-07-19 DIAGNOSIS — K5900 Constipation, unspecified: Secondary | ICD-10-CM | POA: Diagnosis not present

## 2021-07-19 DIAGNOSIS — R195 Other fecal abnormalities: Secondary | ICD-10-CM | POA: Diagnosis not present

## 2021-07-19 DIAGNOSIS — D649 Anemia, unspecified: Secondary | ICD-10-CM | POA: Diagnosis not present

## 2021-07-26 ENCOUNTER — Ambulatory Visit: Payer: Medicare Other

## 2021-07-26 ENCOUNTER — Other Ambulatory Visit: Payer: Self-pay

## 2021-07-26 DIAGNOSIS — I4821 Permanent atrial fibrillation: Secondary | ICD-10-CM

## 2021-07-26 DIAGNOSIS — I77810 Thoracic aortic ectasia: Secondary | ICD-10-CM

## 2021-07-27 NOTE — Progress Notes (Signed)
Let patient know that the echo shows normal heart function and no change from previous year. Aorta was previosly mildly enlarged and it has remained stable

## 2021-07-28 NOTE — Progress Notes (Signed)
Called pt, no answer. Left vm requesting call back?

## 2021-07-28 NOTE — Progress Notes (Signed)
Called and spoke with patient regarding his echocardiogram results.  ?

## 2021-08-02 DIAGNOSIS — R195 Other fecal abnormalities: Secondary | ICD-10-CM | POA: Diagnosis not present

## 2021-08-08 ENCOUNTER — Ambulatory Visit
Admission: RE | Admit: 2021-08-08 | Discharge: 2021-08-08 | Disposition: A | Discharge status: Home / Self Care | Payer: Medicare Other | Source: Ambulatory Visit | Attending: Physician Assistant | Admitting: Physician Assistant

## 2021-08-08 ENCOUNTER — Other Ambulatory Visit: Payer: Self-pay

## 2021-08-08 DIAGNOSIS — R109 Unspecified abdominal pain: Secondary | ICD-10-CM

## 2021-08-08 DIAGNOSIS — I700 Atherosclerosis of aorta: Secondary | ICD-10-CM | POA: Diagnosis not present

## 2021-08-08 DIAGNOSIS — N281 Cyst of kidney, acquired: Secondary | ICD-10-CM | POA: Diagnosis not present

## 2021-08-08 DIAGNOSIS — R634 Abnormal weight loss: Secondary | ICD-10-CM | POA: Diagnosis not present

## 2021-08-08 DIAGNOSIS — M5136 Other intervertebral disc degeneration, lumbar region: Secondary | ICD-10-CM | POA: Diagnosis not present

## 2021-08-08 MED ORDER — IOPAMIDOL (ISOVUE-300) INJECTION 61%
80.0000 mL | Freq: Once | INTRAVENOUS | Status: AC | PRN
  Administered 2021-08-08: 80 mL via INTRAVENOUS

## 2021-08-16 DIAGNOSIS — R3915 Urgency of urination: Secondary | ICD-10-CM | POA: Diagnosis not present

## 2021-08-16 DIAGNOSIS — R351 Nocturia: Secondary | ICD-10-CM | POA: Diagnosis not present

## 2021-08-16 DIAGNOSIS — N401 Enlarged prostate with lower urinary tract symptoms: Secondary | ICD-10-CM | POA: Diagnosis not present

## 2021-09-12 DIAGNOSIS — I129 Hypertensive chronic kidney disease with stage 1 through stage 4 chronic kidney disease, or unspecified chronic kidney disease: Secondary | ICD-10-CM | POA: Diagnosis not present

## 2021-09-12 DIAGNOSIS — Z8673 Personal history of transient ischemic attack (TIA), and cerebral infarction without residual deficits: Secondary | ICD-10-CM | POA: Diagnosis not present

## 2021-09-12 DIAGNOSIS — N189 Chronic kidney disease, unspecified: Secondary | ICD-10-CM | POA: Diagnosis not present

## 2021-09-12 DIAGNOSIS — R413 Other amnesia: Secondary | ICD-10-CM | POA: Diagnosis not present

## 2021-09-12 DIAGNOSIS — E785 Hyperlipidemia, unspecified: Secondary | ICD-10-CM | POA: Diagnosis not present

## 2021-09-12 DIAGNOSIS — N1832 Chronic kidney disease, stage 3b: Secondary | ICD-10-CM | POA: Diagnosis not present

## 2021-09-12 DIAGNOSIS — I4821 Permanent atrial fibrillation: Secondary | ICD-10-CM | POA: Diagnosis not present

## 2021-09-26 ENCOUNTER — Other Ambulatory Visit: Payer: Self-pay | Admitting: Gastroenterology

## 2021-09-26 DIAGNOSIS — K625 Hemorrhage of anus and rectum: Secondary | ICD-10-CM | POA: Diagnosis not present

## 2021-09-26 DIAGNOSIS — I4891 Unspecified atrial fibrillation: Secondary | ICD-10-CM | POA: Diagnosis not present

## 2021-09-26 DIAGNOSIS — K4090 Unilateral inguinal hernia, without obstruction or gangrene, not specified as recurrent: Secondary | ICD-10-CM | POA: Diagnosis not present

## 2021-09-26 DIAGNOSIS — R634 Abnormal weight loss: Secondary | ICD-10-CM | POA: Diagnosis not present

## 2021-09-26 DIAGNOSIS — K5900 Constipation, unspecified: Secondary | ICD-10-CM | POA: Diagnosis not present

## 2021-09-26 DIAGNOSIS — D649 Anemia, unspecified: Secondary | ICD-10-CM | POA: Diagnosis not present

## 2021-09-28 ENCOUNTER — Encounter (HOSPITAL_COMMUNITY): Payer: Self-pay | Admitting: Gastroenterology

## 2021-09-28 NOTE — Progress Notes (Signed)
Attempted to obtain medical history via telephone, unable to reach at this time. HIPAA compliant voicemail message left requesting return call to pre surgical testing department. 

## 2021-10-03 ENCOUNTER — Encounter: Payer: Self-pay | Admitting: Cardiology

## 2021-10-14 ENCOUNTER — Other Ambulatory Visit: Payer: Self-pay

## 2021-10-14 ENCOUNTER — Ambulatory Visit (HOSPITAL_BASED_OUTPATIENT_CLINIC_OR_DEPARTMENT_OTHER): Payer: Medicare Other | Admitting: Certified Registered Nurse Anesthetist

## 2021-10-14 ENCOUNTER — Ambulatory Visit (HOSPITAL_COMMUNITY): Payer: Medicare Other | Admitting: Certified Registered Nurse Anesthetist

## 2021-10-14 ENCOUNTER — Encounter (HOSPITAL_COMMUNITY)
Admission: RE | Disposition: A | Discharge status: Home / Self Care | Payer: Self-pay | Source: Ambulatory Visit | Attending: Gastroenterology

## 2021-10-14 ENCOUNTER — Encounter (HOSPITAL_COMMUNITY): Payer: Self-pay | Admitting: Gastroenterology

## 2021-10-14 ENCOUNTER — Ambulatory Visit (HOSPITAL_COMMUNITY)
Admission: RE | Admit: 2021-10-14 | Discharge: 2021-10-14 | Disposition: A | Discharge status: Home / Self Care | Payer: Medicare Other | Source: Ambulatory Visit | Attending: Gastroenterology | Admitting: Gastroenterology

## 2021-10-14 DIAGNOSIS — K5730 Diverticulosis of large intestine without perforation or abscess without bleeding: Secondary | ICD-10-CM | POA: Diagnosis not present

## 2021-10-14 DIAGNOSIS — Z8673 Personal history of transient ischemic attack (TIA), and cerebral infarction without residual deficits: Secondary | ICD-10-CM | POA: Diagnosis not present

## 2021-10-14 DIAGNOSIS — D500 Iron deficiency anemia secondary to blood loss (chronic): Secondary | ICD-10-CM | POA: Diagnosis not present

## 2021-10-14 DIAGNOSIS — I4891 Unspecified atrial fibrillation: Secondary | ICD-10-CM | POA: Diagnosis not present

## 2021-10-14 DIAGNOSIS — D128 Benign neoplasm of rectum: Secondary | ICD-10-CM | POA: Insufficient documentation

## 2021-10-14 DIAGNOSIS — Z87891 Personal history of nicotine dependence: Secondary | ICD-10-CM | POA: Insufficient documentation

## 2021-10-14 DIAGNOSIS — K621 Rectal polyp: Secondary | ICD-10-CM | POA: Diagnosis not present

## 2021-10-14 DIAGNOSIS — N183 Chronic kidney disease, stage 3 unspecified: Secondary | ICD-10-CM | POA: Insufficient documentation

## 2021-10-14 DIAGNOSIS — K625 Hemorrhage of anus and rectum: Secondary | ICD-10-CM | POA: Diagnosis not present

## 2021-10-14 DIAGNOSIS — K648 Other hemorrhoids: Secondary | ICD-10-CM | POA: Diagnosis not present

## 2021-10-14 DIAGNOSIS — I10 Essential (primary) hypertension: Secondary | ICD-10-CM | POA: Diagnosis not present

## 2021-10-14 DIAGNOSIS — D122 Benign neoplasm of ascending colon: Secondary | ICD-10-CM | POA: Diagnosis not present

## 2021-10-14 DIAGNOSIS — D123 Benign neoplasm of transverse colon: Secondary | ICD-10-CM | POA: Diagnosis not present

## 2021-10-14 DIAGNOSIS — Z79899 Other long term (current) drug therapy: Secondary | ICD-10-CM | POA: Diagnosis not present

## 2021-10-14 DIAGNOSIS — I129 Hypertensive chronic kidney disease with stage 1 through stage 4 chronic kidney disease, or unspecified chronic kidney disease: Secondary | ICD-10-CM | POA: Diagnosis not present

## 2021-10-14 DIAGNOSIS — Z6823 Body mass index (BMI) 23.0-23.9, adult: Secondary | ICD-10-CM | POA: Diagnosis not present

## 2021-10-14 DIAGNOSIS — K5900 Constipation, unspecified: Secondary | ICD-10-CM

## 2021-10-14 DIAGNOSIS — K5731 Diverticulosis of large intestine without perforation or abscess with bleeding: Secondary | ICD-10-CM | POA: Diagnosis not present

## 2021-10-14 DIAGNOSIS — R634 Abnormal weight loss: Secondary | ICD-10-CM | POA: Diagnosis not present

## 2021-10-14 DIAGNOSIS — K635 Polyp of colon: Secondary | ICD-10-CM | POA: Diagnosis not present

## 2021-10-14 HISTORY — PX: HEMOSTASIS CLIP PLACEMENT: SHX6857

## 2021-10-14 HISTORY — PX: POLYPECTOMY: SHX5525

## 2021-10-14 HISTORY — PX: COLONOSCOPY WITH PROPOFOL: SHX5780

## 2021-10-14 SURGERY — COLONOSCOPY WITH PROPOFOL
Anesthesia: Monitor Anesthesia Care

## 2021-10-14 MED ORDER — LACTATED RINGERS IV SOLN: INTRAVENOUS | Status: DC

## 2021-10-14 MED ORDER — SODIUM CHLORIDE 0.9 % IV SOLN: INTRAVENOUS | Status: DC

## 2021-10-14 MED ORDER — PROPOFOL 500 MG/50ML IV EMUL
INTRAVENOUS | Status: DC | PRN
  Administered 2021-10-14: 60 ug/kg/min via INTRAVENOUS

## 2021-10-14 MED ORDER — PROPOFOL 10 MG/ML IV BOLUS
INTRAVENOUS | Status: DC | PRN
  Administered 2021-10-14: 50 mg via INTRAVENOUS

## 2021-10-14 SURGICAL SUPPLY — 22 items

## 2021-10-14 NOTE — H&P (Signed)
Primary Care Physician:  [name], MD Primary Gastroenterologist:  [facility]  Reason for Visit : Outpatient colonoscopy  HPI: Adrian Daniels is a 87 y.o. male with past medical history of atrial fibrillation was on Eliquis was seen in the GI office for evaluation of constipation, weight loss and bright red blood in the rectum.  Patient CT scan showed constipation otherwise no acute changes.  Last colonoscopy was in 2014.  Holding his Eliquis for 2 days now.  Past Medical History:  Diagnosis Date   Anemia, unspecified    Aortic regurgitation    Aortic root dilation (HCC)    Atrial fibrillation (HCC)    Atypical chest pain 06/30/2018   CKD (chronic kidney disease) stage 3, GFR 30-59 ml/min ([place]) 06/30/2018   Dysrhythmia    Essential hypertension 06/30/2018   H/O TIA (transient ischemic attack) and stroke 06/30/2005   Hemorrhoids    History of colon polyps 05/28/2007   Adenomatous polyp(Colonoscopy with Dr. Lajoyce Corners)   Hx of adenomatous colonic polyps 02/24/2003   2004,2006,2009 Dr. Lajoyce Corners   Hyperlipemia    Hypertension    Internal hemorrhoids, bleeding and Grade 3 prolapse 12/09/2014   Wears dentures     Past Surgical History:  Procedure Laterality Date   CARDIOVERSION N/A 03/02/2015   Procedure: CARDIOVERSION;  Surgeon: Adrian Prows, MD;  Location: [facility] ENDOSCOPY;  Service: Cardiovascular;  Laterality: N/A;   COLONOSCOPY  2009   HEMORRHOID BANDING  2016   INGUINAL HERNIA REPAIR Left 08/10/2015   Procedure: LEFT INGUINAL HERNIA REPAIR WITH MESH;  Surgeon: Donnie Mesa, MD;  Location: [facility];  Service: General;  Laterality: Left;   INSERTION OF MESH Left 08/10/2015   Procedure: INSERTION OF MESH;  Surgeon: Donnie Mesa, MD;  Location: [place];  Service: General;  Laterality: Left;   MULTIPLE TOOTH EXTRACTIONS      Prior to Admission medications   Medication Sig Start Date End Date Taking? Authorizing Provider  apixaban (ELIQUIS) 2.5 MG TABS tablet Take 2.5 mg by mouth 2 (two)  times daily.   Yes [provider]  diltiazem (CARDIZEM CD) 300 MG 24 hr capsule Take 300 mg by mouth daily.   Yes [provider]  losartan (COZAAR) 50 MG tablet Take 50 mg by mouth daily. 09/12/21  Yes [provider]  Polyethyl Glycol-Propyl Glycol (SYSTANE) 0.4-0.3 % SOLN Place 1 drop into both eyes daily as needed (dry eyes).   Yes [provider]  rosuvastatin (CRESTOR) 20 MG tablet Take 20 mg by mouth daily. 06/08/20  Yes [provider]    Scheduled Meds: Continuous Infusions:  sodium chloride     lactated ringers 10 mL/hr at 10/14/21 0814   PRN Meds:.  Allergies as of 09/26/2021   (No Known Allergies)    Family History  Problem Relation Age of Onset   Diabetes Daughter    Cervical cancer Sister    Colon cancer Neg Hx    Colon polyps Neg Hx     Social History   Socioeconomic History   Marital status: Single    Spouse name: Not on file   Number of children: 7   Years of education: Not on file   Highest education level: Not on file  Occupational History   Not on file  Tobacco Use   Smoking status: Former    Packs/day: 0.50    Years: 30.00    Pack years: 15.00    Types: Cigarettes    Quit date: 07/08/1989    Years  since quitting: 32.2   Smokeless tobacco: Never  Vaping Use   Vaping Use: Never used  Substance and Sexual Activity   Alcohol use: Yes    Alcohol/week: 4.0 standard drinks    Types: 4 Standard drinks or equivalent per week    Comment: 4 per week   Drug use: No   Sexual activity: Not on file  Other Topics Concern   Not on file  Social History Narrative   Not on file   Social Determinants of Health   Financial Resource Strain: Not on file  Food Insecurity: Not on file  Transportation Needs: Not on file  Physical Activity: Not on file  Stress: Not on file  Social Connections: Not on file  Intimate Partner Violence: Not on file    Review of Systems: All negative except as stated above in  HPI.  Physical Exam: Vital signs: Vitals:   10/14/21 0732  BP: (!) 173/91  Pulse: 74  Resp: 15  Temp: 98.2 F (36.8 C)  SpO2: 99%     General:   Alert,  Well-developed, well-nourished, pleasant and cooperative in NAD Lungs:  Clear throughout to auscultation.   No wheezes, crackles, or rhonchi. No acute distress. Heart:  Regular rate and rhythm; no murmurs, clicks, rubs,  or gallops. Abdomen: Soft, nontender, nondistended, bowel sounds present Rectal:  Deferred  GI:  Lab Results: No results for input(s): WBC, HGB, HCT, PLT in the last 72 hours. BMET No results for input(s): NA, K, CL, CO2, GLUCOSE, BUN, CREATININE, CALCIUM in the last 72 hours. LFT No results for input(s): PROT, ALBUMIN, AST, ALT, ALKPHOS, BILITOT, BILIDIR, IBILI in the last 72 hours. PT/INR No results for input(s): LABPROT, INR in the last 72 hours.   Studies/Results: No results found.  Impression/Plan: -Bright red blood per rectum -Weight loss -Constipation -Anemia -Atrial fibrillation.  Holding Eliquis for 2 days  Recommendations ------------------------- -Proceed with colonoscopy today.  Risks (bleeding, infection, bowel perforation that could require surgery, sedation-related changes in cardiopulmonary systems), benefits (identification and possible treatment of source of symptoms, exclusion of certain causes of symptoms), and alternatives (watchful waiting, radiographic imaging studies, empiric medical treatment)  were explained to patient/family in detail and patient wishes to proceed.    LOS: 0 days   Otis Brace  MD, FACP 10/14/2021, 8:23 AM  Contact #  [phone]

## 2021-10-14 NOTE — Transfer of Care (Signed)
Immediate Anesthesia Transfer of Care Note  Patient: Elyn Aquas Restivo  Procedure(s) Performed: Procedure(s): COLONOSCOPY WITH PROPOFOL (N/A) POLYPECTOMY  Patient Location: PACU  Anesthesia Type:MAC  Level of Consciousness: Patient easily awoken, sedated, comfortable, cooperative, following commands, responds to stimulation.   Airway & Oxygen Therapy: Patient spontaneously breathing, ventilating well, oxygen via simple oxygen mask.  Post-op Assessment: Report given to PACU RN, vital signs reviewed and stable, moving all extremities.   Post vital signs: Reviewed and stable.  Complications: No apparent anesthesia complications  Last Vitals:  Vitals Value Taken Time  BP    Temp    Pulse 62 10/14/21 0917  Resp 11 10/14/21 0917  SpO2 100 % 10/14/21 0917  Vitals shown include unvalidated device data.  Last Pain:  Vitals:   10/14/21 0732  TempSrc: Temporal  PainSc: 0-No pain         Complications: No notable events documented.

## 2021-10-14 NOTE — Discharge Instructions (Signed)
YOU HAD AN ENDOSCOPIC PROCEDURE TODAY: Refer to the procedure report and other information in the discharge instructions given to you for any specific questions about what was found during the examination. If this information does not answer your questions, please call [facility] office at [phone] to clarify.   YOU SHOULD EXPECT: Some feelings of bloating in the abdomen. Passage of more gas than usual. Walking can help get rid of the air that was put into your GI tract during the procedure and reduce the bloating. If you had a lower endoscopy (such as a colonoscopy or flexible sigmoidoscopy) you may notice spotting of blood in your stool or on the toilet paper. Some abdominal soreness may be present for a day or two, also.  DIET: Your first meal following the procedure should be a light meal and then it is ok to progress to your normal diet. A half-sandwich or bowl of soup is an example of a good first meal. Heavy or fried foods are harder to digest and may make you feel nauseous or bloated. Drink plenty of fluids but you should avoid alcoholic beverages for 24 hours. If you had a esophageal dilation, please see attached instructions for diet.    ACTIVITY: Your care partner should take you home directly after the procedure. You should plan to take it easy, moving slowly for the rest of the day. You can resume normal activity the day after the procedure however YOU SHOULD NOT DRIVE, use power tools, machinery or perform tasks that involve climbing or major physical exertion for 24 hours (because of the sedation medicines used during the test).   SYMPTOMS TO REPORT IMMEDIATELY: A gastroenterologist can be reached at any hour. Please call [phone]  for any of the following symptoms:  Following lower endoscopy (colonoscopy, flexible sigmoidoscopy) Excessive amounts of blood in the stool  Significant tenderness, worsening of abdominal pains  Swelling of the abdomen that is new, acute  Fever of 100  or higher   FOLLOW UP:  If any biopsies were taken you will be contacted by phone or by letter within the next 1-3 weeks. Call [phone]  if you have not heard about the biopsies in 3 weeks.  Please also call with any specific questions about appointments or follow up tests. YOU HAD AN ENDOSCOPIC PROCEDURE TODAY: Refer to the procedure report and other information in the discharge instructions given to you for any specific questions about what was found during the examination. If this information does not answer your questions, please call [facility] office at [phone] to clarify.  

## 2021-10-14 NOTE — Anesthesia Procedure Notes (Signed)
Procedure Name: MAC Date/Time: 10/14/2021 8:27 AM Performed by: Deliah Boston, CRNA Pre-anesthesia Checklist: Patient identified, Emergency Drugs available, Suction available and Patient being monitored Patient Re-evaluated:Patient Re-evaluated prior to induction Oxygen Delivery Method: Simple face mask Preoxygenation: Pre-oxygenation with 100% oxygen Induction Type: IV induction Placement Confirmation: positive ETCO2 and breath sounds checked- equal and bilateral

## 2021-10-14 NOTE — Anesthesia Postprocedure Evaluation (Signed)
Anesthesia Post Note  Patient: Adrian Daniels  Procedure(s) Performed: COLONOSCOPY WITH PROPOFOL POLYPECTOMY     Patient location during evaluation: PACU Anesthesia Type: MAC Level of consciousness: awake and alert Pain management: pain level controlled Vital Signs Assessment: post-procedure vital signs reviewed and stable Respiratory status: spontaneous breathing, nonlabored ventilation, respiratory function stable and patient connected to nasal cannula oxygen Cardiovascular status: stable and blood pressure returned to baseline Postop Assessment: no apparent nausea or vomiting Anesthetic complications: no   No notable events documented.  Last Vitals:  Vitals:   10/14/21 0920 10/14/21 0930  BP: (!) 143/86 (!) 149/82  Pulse: 68 63  Resp: 19 (!) 9  Temp:    SpO2: 100% 100%    Last Pain:  Vitals:   10/14/21 0930  TempSrc:   PainSc: 0-No pain                 Vondell Babers S

## 2021-10-14 NOTE — Anesthesia Preprocedure Evaluation (Signed)
Anesthesia Evaluation  Patient identified by MRN, date of birth, ID band Patient awake    Reviewed: Allergy & Precautions, NPO status , Patient's Chart, lab work & pertinent test results  Airway Mallampati: II  TM Distance: >3 FB Neck ROM: Full    Dental no notable dental hx.    Pulmonary neg pulmonary ROS, former smoker,    Pulmonary exam normal breath sounds clear to auscultation       Cardiovascular hypertension, Pt. on medications + dysrhythmias Atrial Fibrillation + Valvular Problems/Murmurs AI  Rhythm:Irregular Rate:Normal     Neuro/Psych TIAnegative psych ROS   GI/Hepatic negative GI ROS, Neg liver ROS,   Endo/Other  negative endocrine ROS  Renal/GU Renal InsufficiencyRenal disease  negative genitourinary   Musculoskeletal negative musculoskeletal ROS (+)   Abdominal   Peds negative pediatric ROS (+)  Hematology negative hematology ROS (+)   Anesthesia Other Findings   Reproductive/Obstetrics negative OB ROS                             Anesthesia Physical Anesthesia Plan  ASA: 3  Anesthesia Plan: MAC   Post-op Pain Management: Minimal or no pain anticipated   Induction: Intravenous  PONV Risk Score and Plan: 1 and Propofol infusion and Treatment may vary due to age or medical condition  Airway Management Planned: Simple Face Mask  Additional Equipment:   Intra-op Plan:   Post-operative Plan:   Informed Consent: I have reviewed the patients History and Physical, chart, labs and discussed the procedure including the risks, benefits and alternatives for the proposed anesthesia with the patient or authorized representative who has indicated his/her understanding and acceptance.     Dental advisory given  Plan Discussed with: CRNA and Surgeon  Anesthesia Plan Comments:         Anesthesia Quick Evaluation

## 2021-10-14 NOTE — Op Note (Signed)
[facility] Patient Name: Adrian Daniels Procedure Date: 10/14/2021 MRN: [id number] Attending MD: Otis Brace , MD Date of Birth: [birth year] CSN: [id number] Age: 87 Admit Type: Ambulatory Procedure:                Colonoscopy Indications:              Rectal bleeding, Constipation, Weight loss Providers:                Otis Brace, MD, Allayne Gitelman, RN, Cherylynn Ridges, Technician, Darliss Cheney, Technician Referring MD:              Medicines:                Sedation Administered by an Anesthesia Professional Complications:            No immediate complications. Estimated Blood Loss:     Estimated blood loss was minimal. Procedure:                Pre-Anesthesia Assessment:                           - Prior to the procedure, a History and Physical                            was performed, and patient medications and                            allergies were reviewed. The patient's tolerance of                            previous anesthesia was also reviewed. The risks                            and benefits of the procedure and the sedation                            options and risks were discussed with the patient.                            All questions were answered, and informed consent                            was obtained. Prior Anticoagulants: The patient has                            taken Eliquis (apixaban), last dose was 2 days                            prior to procedure. ASA Grade Assessment: III - A                            patient with severe systemic disease. After  reviewing the risks and benefits, the patient was                            deemed in satisfactory condition to undergo the                            procedure.                           After obtaining informed consent, the colonoscope                            was passed under direct vision. Throughout the                             procedure, the patient's blood pressure, pulse, and                            oxygen saturations were monitored continuously. The                            PCF-HQ190L ([id number] Olympus colonoscope was                            introduced through the anus and advanced to the the                            terminal ileum, with identification of the                            appendiceal orifice and IC valve. The colonoscopy                            was performed with moderate difficulty due to a                            redundant colon. Successful completion of the                            procedure was aided by applying abdominal pressure.                            The patient tolerated the procedure well. The                            quality of the bowel preparation was fair. Scope In: 8:36:03 AM Scope Out: 9:08:26 AM Scope Withdrawal Time: 0 hours 17 minutes 45 seconds  Total Procedure Duration: 0 hours 32 minutes 23 seconds  Findings:      The perianal and digital rectal examinations were normal.      The terminal ileum appeared normal.      A 10 mm polyp was found in the ascending colon. The polyp was       semi-sessile. The polyp was removed with a piecemeal technique using a  cold snare. Resection and retrieval were complete.      A 6 mm polyp was found in the ascending colon. The polyp was       pedunculated. The polyp was removed with a cold snare. Resection and       retrieval were complete.      A 7 mm polyp was found in the transverse colon. The polyp was sessile.       The polyp was removed with a cold snare. Resection and retrieval were       complete.      Many diverticula were found in the sigmoid colon.      A 5 mm polyp was found in the rectum. The polyp was sessile. The polyp       was removed with a cold snare. Resection and retrieval were complete. To       prevent bleeding after the polypectomy, one hemostatic clip was       successfully  placed. There was no bleeding at the end of the procedure.      Internal hemorrhoids were found during retroflexion. The hemorrhoids       were large. Impression:               - Preparation of the colon was fair.                           - The examined portion of the ileum was normal.                           - One 10 mm polyp in the ascending colon, removed                            piecemeal using a cold snare. Resected and                            retrieved.                           - One 6 mm polyp in the ascending colon, removed                            with a cold snare. Resected and retrieved.                           - One 7 mm polyp in the transverse colon, removed                            with a cold snare. Resected and retrieved.                           - Diverticulosis in the sigmoid colon.                           - One 5 mm polyp in the rectum, removed with a cold                            snare. Resected and retrieved. Clip was placed.                           -  Internal hemorrhoids. Moderate Sedation:      Moderate (conscious) sedation was personally administered by an       anesthesia professional. The following parameters were monitored: oxygen       saturation, heart rate, blood pressure, and response to care. Recommendation:           - Patient has a contact number available for                            emergencies. The signs and symptoms of potential                            delayed complications were discussed with the                            patient. Return to normal activities tomorrow.                            Written discharge instructions were provided to the                            patient.                           - Resume previous diet.                           - Continue present medications.                           - Await pathology results.                           - No repeat colonoscopy due to current age (43                             years or older).                           - Resume Eliquis (apixaban) at prior dose in 3 days.                           - No aspirin, ibuprofen, naproxen, or other                            non-steroidal anti-inflammatory drugs for 7 days                            after polyp removal. Procedure Code(s):        --- Professional ---                           [phone], Colonoscopy, flexible; with removal of                            tumor(s), polyp(s), or other lesion(s) by snare  technique Diagnosis Code(s):        --- Professional ---                           K64.8, Other hemorrhoids                           K63.5, Polyp of colon                           K62.1, Rectal polyp                           K62.5, Hemorrhage of anus and rectum                           K59.00, Constipation, unspecified                           R63.4, Abnormal weight loss                           K57.30, Diverticulosis of large intestine without                            perforation or abscess without bleeding CPT copyright 2019 [employer]. All rights reserved. The codes documented in this report are preliminary and upon coder review may  be revised to meet current compliance requirements. Otis Brace, MD Otis Brace, MD 10/14/2021 9:32:42 AM Number of Addenda: 0

## 2021-10-17 LAB — SURGICAL PATHOLOGY

## 2021-10-19 DIAGNOSIS — I129 Hypertensive chronic kidney disease with stage 1 through stage 4 chronic kidney disease, or unspecified chronic kidney disease: Secondary | ICD-10-CM | POA: Diagnosis not present

## 2021-10-19 DIAGNOSIS — N1832 Chronic kidney disease, stage 3b: Secondary | ICD-10-CM | POA: Diagnosis not present

## 2021-12-05 ENCOUNTER — Ambulatory Visit (HOSPITAL_COMMUNITY)
Admission: EM | Admit: 2021-12-05 | Discharge: 2021-12-05 | Disposition: A | Discharge status: Home / Self Care | Payer: Medicare Other | Attending: Physician Assistant | Admitting: Physician Assistant

## 2021-12-05 ENCOUNTER — Encounter (HOSPITAL_COMMUNITY): Payer: Self-pay

## 2021-12-05 ENCOUNTER — Ambulatory Visit (INDEPENDENT_AMBULATORY_CARE_PROVIDER_SITE_OTHER): Payer: Medicare Other

## 2021-12-05 DIAGNOSIS — J329 Chronic sinusitis, unspecified: Secondary | ICD-10-CM

## 2021-12-05 DIAGNOSIS — R0989 Other specified symptoms and signs involving the circulatory and respiratory systems: Secondary | ICD-10-CM | POA: Diagnosis not present

## 2021-12-05 DIAGNOSIS — J40 Bronchitis, not specified as acute or chronic: Secondary | ICD-10-CM

## 2021-12-05 DIAGNOSIS — R0689 Other abnormalities of breathing: Secondary | ICD-10-CM | POA: Diagnosis not present

## 2021-12-05 DIAGNOSIS — R051 Acute cough: Secondary | ICD-10-CM

## 2021-12-05 MED ORDER — BENZONATATE 100 MG PO CAPS: 100.0000 mg | ORAL_CAPSULE | Freq: Three times a day (TID) | ORAL | 0 refills | Status: AC

## 2021-12-05 MED ORDER — DOXYCYCLINE HYCLATE 100 MG PO CAPS: 100.0000 mg | ORAL_CAPSULE | Freq: Two times a day (BID) | ORAL | 0 refills | Status: AC

## 2021-12-05 NOTE — ED Triage Notes (Signed)
Pt c/o cough and nasal sx since Friday. Pt states his cough is productive but is not sure the color of the sputum.

## 2021-12-05 NOTE — ED Provider Notes (Signed)
[facility]    CSN: [id number] Arrival date & time: 12/05/21  1607      History   Chief Complaint Chief Complaint  Patient presents with   Cough    HPI Adrian Daniels is a 87 y.o. male.   Patient presents today with a 5+ day history of URI symptoms.  Reports cough and nasal congestion.  Denies any fever, chest pain, shortness of breath, nausea, vomiting.  Denies any known sick contacts but does go eat at [facility] was exposed to many people.  He has had COVID-19 vaccine in the past.  He has not had COVID.  Denies history of allergies, asthma, COPD.  He is a former smoker but quit many years ago.  He has not tried any over-the-counter medication.  Denies any recent steroid or antibiotic use.  Reports that cough is worsening and keeping him up at night.  He reports that this is productive.    Past Medical History:  Diagnosis Date   Anemia, unspecified    Aortic regurgitation    Aortic root dilation (HCC)    Atrial fibrillation (HCC)    Atypical chest pain 06/30/2018   CKD (chronic kidney disease) stage 3, GFR 30-59 ml/min ([place]) 06/30/2018   Dysrhythmia    Essential hypertension 06/30/2018   H/O TIA (transient ischemic attack) and stroke 06/30/2005   Hemorrhoids    History of colon polyps 05/28/2007   Adenomatous polyp(Colonoscopy with Dr. Lajoyce Corners)   Hx of adenomatous colonic polyps 02/24/2003   2004,2006,2009 Dr. Lajoyce Corners   Hyperlipemia    Hypertension    Internal hemorrhoids, bleeding and Grade 3 prolapse 12/09/2014   Wears dentures     Patient Active Problem List   Diagnosis Date Noted   Hypokalemia 07/21/2018   Near syncope 07/20/2018   Atypical chest pain 06/30/2018   Essential hypertension 06/30/2018   CKD (chronic kidney disease) stage 3, GFR 30-59 ml/min ([place]) 06/30/2018   Laboratory examination 06/30/2018   Atrial fibrillation ([place]) 12/09/2014   H/O TIA (transient ischemic attack) and stroke 06/30/2005   Hx of adenomatous colonic polyps 02/24/2003    Past  Surgical History:  Procedure Laterality Date   CARDIOVERSION N/A 03/02/2015   Procedure: CARDIOVERSION;  Surgeon: Adrian Prows, MD;  Location: [facility];  Service: Cardiovascular;  Laterality: N/A;   COLONOSCOPY  2009   COLONOSCOPY WITH PROPOFOL N/A 10/14/2021   Procedure: COLONOSCOPY WITH PROPOFOL;  Surgeon: Otis Brace, MD;  Location: [facility];  Service: Gastroenterology;  Laterality: N/A;   HEMORRHOID BANDING  2016   HEMOSTASIS CLIP PLACEMENT  10/14/2021   Procedure: HEMOSTASIS CLIP PLACEMENT;  Surgeon: Otis Brace, MD;  Location: [facility];  Service: Gastroenterology;;   INGUINAL HERNIA REPAIR Left 08/10/2015   Procedure: LEFT INGUINAL HERNIA REPAIR WITH MESH;  Surgeon: Donnie Mesa, MD;  Location: [facility];  Service: General;  Laterality: Left;   INSERTION OF MESH Left 08/10/2015   Procedure: INSERTION OF MESH;  Surgeon: Donnie Mesa, MD;  Location: [place];  Service: General;  Laterality: Left;   MULTIPLE TOOTH EXTRACTIONS     POLYPECTOMY  10/14/2021   Procedure: POLYPECTOMY;  Surgeon: Otis Brace, MD;  Location: [facility];  Service: Gastroenterology;;       Home Medications    Prior to Admission medications   Medication Sig Start Date End Date Taking? Authorizing Provider  apixaban (ELIQUIS) 2.5 MG TABS tablet Take 2.5 mg by mouth 2 (two) times daily.   Yes [provider]  benzonatate (TESSALON) 100 MG capsule Take  1 capsule (100 mg total) by mouth every 8 (eight) hours. 12/05/21  Yes Hazley Dezeeuw K, PA-C  diltiazem (CARDIZEM CD) 300 MG 24 hr capsule Take 300 mg by mouth daily.   Yes [provider]  doxycycline (VIBRAMYCIN) 100 MG capsule Take 1 capsule (100 mg total) by mouth 2 (two) times daily. 12/05/21  Yes Tallis Soledad K, PA-C  losartan (COZAAR) 50 MG tablet Take 50 mg by mouth daily. 09/12/21  Yes [provider]  Polyethyl Glycol-Propyl Glycol (SYSTANE) 0.4-0.3 % SOLN Place 1 drop into both eyes daily as needed (dry eyes).   Yes  [provider]  rosuvastatin (CRESTOR) 20 MG tablet Take 20 mg by mouth daily. 06/08/20  Yes [provider]    Family History Family History  Problem Relation Age of Onset   Diabetes Daughter    Cervical cancer Sister    Colon cancer Neg Hx    Colon polyps Neg Hx     Social History Social History   Tobacco Use   Smoking status: Former    Packs/day: 0.50    Years: 30.00    Total pack years: 15.00    Types: Cigarettes    Quit date: 07/08/1989    Years since quitting: 32.4   Smokeless tobacco: Never  Vaping Use   Vaping Use: Never used  Substance Use Topics   Alcohol use: Yes    Alcohol/week: 4.0 standard drinks of alcohol    Types: 4 Standard drinks or equivalent per week    Comment: once weekly   Drug use: No     Allergies   Patient has no known allergies.   Review of Systems Review of Systems  Constitutional:  Positive for activity change. Negative for appetite change, fatigue and fever.  HENT:  Positive for congestion. Negative for sinus pressure, sneezing and sore throat.   Respiratory:  Positive for cough. Negative for shortness of breath.   Cardiovascular:  Negative for chest pain.  Gastrointestinal:  Negative for abdominal pain, diarrhea, nausea and vomiting.  Neurological:  Negative for dizziness, light-headedness and headaches.     Physical Exam Triage Vital Signs ED Triage Vitals  Enc Vitals Group     BP 12/05/21 1728 (!) 155/79     Pulse Rate 12/05/21 1728 (!) 57     Resp 12/05/21 1728 20     Temp 12/05/21 1728 98.3 F (36.8 C)     Temp Source 12/05/21 1728 Oral     SpO2 12/05/21 1728 97 %     Weight --      Height --      Head Circumference --      Peak Flow --      Pain Score 12/05/21 1730 0     Pain Loc --      Pain Edu? --      Excl. in [place]? --    No data found.  Updated Vital Signs BP (!) 155/79 (BP Location: Right Arm)   Pulse (!) 57   Temp 98.3 F (36.8 C) (Oral)   Resp 20   SpO2 97%   Visual  Acuity Right Eye Distance:   Left Eye Distance:   Bilateral Distance:    Right Eye Near:   Left Eye Near:    Bilateral Near:     Physical Exam Vitals reviewed.  Constitutional:      General: He is awake.     Appearance: Normal appearance. He is well-developed. He is not ill-appearing.  Comments: Very pleasant male appears stated age in no acute distress sitting comfortably in exam room  HENT:     Head: Normocephalic and atraumatic.     Right Ear: There is impacted cerumen.     Left Ear: There is impacted cerumen.     Nose: Nose normal.     Mouth/Throat:     Pharynx: Uvula midline. No oropharyngeal exudate or posterior oropharyngeal erythema.  Cardiovascular:     Rate and Rhythm: Normal rate and regular rhythm.     Heart sounds: Normal heart sounds, S1 normal and S2 normal. No murmur heard. Pulmonary:     Effort: Pulmonary effort is normal.     Breath sounds: No stridor. Examination of the right-lower field reveals rales. Rales present. No wheezing or rhonchi.  Neurological:     Mental Status: He is alert.  Psychiatric:        Behavior: Behavior is cooperative.      UC Treatments / Results  Labs (all labs ordered are listed, but only abnormal results are displayed) Labs Reviewed - No data to display  EKG   Radiology DG Chest 2 View  Result Date: 12/05/2021 CLINICAL DATA:  Right lower lobe rales EXAM: CHEST - 2 VIEW COMPARISON:  Chest x-ray dated July 20, 2018 FINDINGS: Stable cardiomegaly. Stable prominence of the bilateral pulmonary arteries, which can be seen in the setting of pulmonary hypertension. Mild left basilar opacity, likely due to atelectasis. No focal consolidation. No pleural effusion or pneumothorax. IMPRESSION: 1. No evidence of pneumonia. 2. Stable cardiomegaly. Electronically Signed   By: Yetta Glassman M.D.   On: 12/05/2021 18:26    Procedures Procedures (including critical care time)  Medications Ordered in UC Medications - No data to  display  Initial Impression / Assessment and Plan / UC Course  I have reviewed the triage vital signs and the nursing notes.  Pertinent labs & imaging results that were available during my care of the patient were reviewed by me and considered in my medical decision making (see chart for details).     Viral testing was deferred as patient has been symptomatic for 5 days and by the time we obtained results he would be outside the window of effectiveness for antiviral medication and this would not change our management.  X-ray was obtained which showed stable cardiomegaly without acute cardiopulmonary disease.  Given recent worsening of symptoms will cover for secondary infection with doxycycline.  Patient was instructed to avoid prolonged sun exposure due to photosensitivity associated this medication.  Recommended Mucinex, Tylenol, Flonase for additional symptom relief.  He was given Tessalon for cough.  Recommended follow-up with his PCP within a few days to ensure improvement of symptoms.  Discussed that if he has any worsening symptoms he needs to go immediately to the emergency room including worsening cough, shortness of breath, weakness, nausea, vomiting.  Strict return precautions given to which he expressed understanding.  Final Clinical Impressions(s) / UC Diagnoses   Final diagnoses:  Sinobronchitis  Acute cough     Discharge Instructions      States your cough is worsening we are going to start an antibiotic.  Your x-ray was normal.  Start doxycycline 100 mg twice daily for 10 days.  Use Mucinex, Flonase, Tylenol for symptom relief.  You can use Tessalon for cough.  Follow-up with your primary care provider within a few days.  If you have any worsening symptoms including shortness of breath, worsening cough, fever, nausea, vomiting, weakness you need  to go to the emergency room immediately.     ED Prescriptions     Medication Sig Dispense Auth. Provider   doxycycline  (VIBRAMYCIN) 100 MG capsule Take 1 capsule (100 mg total) by mouth 2 (two) times daily. 20 capsule Paizlee Kinder K, PA-C   benzonatate (TESSALON) 100 MG capsule Take 1 capsule (100 mg total) by mouth every 8 (eight) hours. 21 capsule Skila Rollins K, PA-C      PDMP not reviewed this encounter.   Terrilee Croak, PA-C 12/05/21 1844

## 2021-12-05 NOTE — Discharge Instructions (Signed)
States your cough is worsening we are going to start an antibiotic.  Your x-ray was normal.  Start doxycycline 100 mg twice daily for 10 days.  Use Mucinex, Flonase, Tylenol for symptom relief.  You can use Tessalon for cough.  Follow-up with your primary care provider within a few days.  If you have any worsening symptoms including shortness of breath, worsening cough, fever, nausea, vomiting, weakness you need to go to the emergency room immediately.

## 2021-12-13 DIAGNOSIS — H2513 Age-related nuclear cataract, bilateral: Secondary | ICD-10-CM | POA: Diagnosis not present

## 2021-12-13 DIAGNOSIS — H0102A Squamous blepharitis right eye, upper and lower eyelids: Secondary | ICD-10-CM | POA: Diagnosis not present

## 2021-12-13 DIAGNOSIS — H0288B Meibomian gland dysfunction left eye, upper and lower eyelids: Secondary | ICD-10-CM | POA: Diagnosis not present

## 2021-12-13 DIAGNOSIS — H0288A Meibomian gland dysfunction right eye, upper and lower eyelids: Secondary | ICD-10-CM | POA: Diagnosis not present

## 2021-12-13 DIAGNOSIS — H0102B Squamous blepharitis left eye, upper and lower eyelids: Secondary | ICD-10-CM | POA: Diagnosis not present

## 2021-12-13 DIAGNOSIS — H04123 Dry eye syndrome of bilateral lacrimal glands: Secondary | ICD-10-CM | POA: Diagnosis not present

## 2021-12-13 DIAGNOSIS — H02834 Dermatochalasis of left upper eyelid: Secondary | ICD-10-CM | POA: Diagnosis not present

## 2021-12-13 DIAGNOSIS — H02831 Dermatochalasis of right upper eyelid: Secondary | ICD-10-CM | POA: Diagnosis not present

## 2021-12-23 DIAGNOSIS — J209 Acute bronchitis, unspecified: Secondary | ICD-10-CM | POA: Diagnosis not present

## 2021-12-23 DIAGNOSIS — H6123 Impacted cerumen, bilateral: Secondary | ICD-10-CM | POA: Diagnosis not present

## 2022-01-04 DIAGNOSIS — E039 Hypothyroidism, unspecified: Secondary | ICD-10-CM | POA: Diagnosis not present

## 2022-01-04 DIAGNOSIS — D649 Anemia, unspecified: Secondary | ICD-10-CM | POA: Diagnosis not present

## 2022-01-17 ENCOUNTER — Ambulatory Visit: Payer: Self-pay | Admitting: General Surgery

## 2022-01-17 DIAGNOSIS — K4090 Unilateral inguinal hernia, without obstruction or gangrene, not specified as recurrent: Secondary | ICD-10-CM | POA: Diagnosis not present

## 2022-01-17 NOTE — H&P (Signed)
Chief Complaint: Follow-up       History of Present Illness: Adrian Daniels is a 87 y.o. male who is seen today as an office consultation at the request of Dr. Pasty Arch for evaluation of Follow-up .   Patient follows up today for right inguinal hernia.   Patient previously been seen secondary to right inguinal hernia however for had his surgery scheduled but was canceled secondary to stroke symptoms.   Patient was evaluated by neurology.  At this point he is 6 months out and they feel he is okay for surgery at this point with an acceptable risk.  Patient currently on Eliquis is being managed by Dr. Einar Gip.   We will reach out to Dr. Nadyne Coombes for clearance to be off of his Eliquis.   Patient states he had no further symptoms from his hernia.         Review of Systems: A complete review of systems was obtained from the patient.  I have reviewed this information and discussed as appropriate with the patient.  See HPI as well for other ROS.   Review of Systems  Constitutional:  Negative for fever.  HENT:  Negative for congestion.   Eyes:  Negative for blurred vision.  Respiratory:  Negative for cough, shortness of breath and wheezing.   Cardiovascular:  Negative for chest pain and palpitations.  Gastrointestinal:  Negative for heartburn.  Genitourinary:  Negative for dysuria.  Musculoskeletal:  Negative for myalgias.  Skin:  Negative for rash.  Neurological:  Negative for dizziness and headaches.  Psychiatric/Behavioral:  Negative for depression and suicidal ideas.   All other systems reviewed and are negative.      Medical History: Past Medical History Past Medical History: Diagnosis Date  History of stroke    Hyperlipidemia    Hypertension        There is no problem list on file for this patient.     Past Surgical History Past Surgical History: Procedure Laterality Date  hernia surgery          Allergies No Known Allergies    Current Outpatient Medications on  File Prior to Visit Medication Sig Dispense Refill  apixaban (ELIQUIS) 2.5 mg tablet Take 1 tablet by mouth 2 (two) times daily      desloratadine (CLARINEX) 5 mg tablet 1 tablet      diltiazem (CARDIZEM CD) 300 MG CD capsule Take 1 capsule by mouth once daily      ezetimibe (ZETIA) 10 mg tablet 1 tablet      losartan-hydrochlorothiazide (HYZAAR) 100-12.5 mg tablet Take 1 tablet by mouth once daily      rosuvastatin (CRESTOR) 20 MG tablet 1 tablet      tamsulosin (FLOMAX) 0.4 mg capsule 1 capsule 30 minutes after the same meal each day       No current facility-administered medications on file prior to visit.     Family History History reviewed. No pertinent family history.     Social History   Tobacco Use Smoking Status Never Smokeless Tobacco Never     Social History Social History    Socioeconomic History  Marital status: Single Tobacco Use  Smoking status: Never  Smokeless tobacco: Never Vaping Use  Vaping Use: Never used Substance and Sexual Activity  Alcohol use: Never  Drug use: Never      Objective:     There were no vitals filed for this visit.  There is no height or weight on file to calculate BMI. Physical Exam  Constitutional:      Appearance: Normal appearance.  HENT:     Head: Normocephalic and atraumatic.     Nose: Nose normal. No congestion.     Mouth/Throat:     Mouth: Mucous membranes are moist.     Pharynx: Oropharynx is clear.  Eyes:     Pupils: Pupils are equal, round, and reactive to light.  Cardiovascular:     Rate and Rhythm: Normal rate and regular rhythm.     Pulses: Normal pulses.     Heart sounds: Normal heart sounds. No murmur heard.   No friction rub. No gallop.  Pulmonary:     Effort: Pulmonary effort is normal. No respiratory distress.     Breath sounds: Normal breath sounds. No stridor. No wheezing, rhonchi or rales.  Abdominal:     General: Abdomen is flat.     Hernia: A hernia is present. Hernia is present in the  right inguinal area.  Musculoskeletal:        General: Normal range of motion.     Cervical back: Normal range of motion.  Skin:    General: Skin is warm and dry.  Neurological:     General: No focal deficit present.     Mental Status: He is alert and oriented to person, place, and time.  Psychiatric:        Mood and Affect: Mood normal.        Thought Content: Thought content normal.        Assessment and Plan: Diagnoses and all orders for this visit:   Non-recurrent unilateral inguinal hernia without obstruction or gangrene     [name] is a 87 y.o. male    1.  We will proceed to the OR for a laparoscopic right inguinal hernia repair with mesh. 2. All risks and benefits were discussed with the patient, to generally include infection, bleeding, damage to surrounding structures, acute and chronic nerve pain, and recurrence. Alternatives were offered and described.  All questions were answered and the patient voiced understanding of the procedure and wishes to proceed at this point.             No follow-ups on file.   Ralene Ok, MD, [facility], [facility]

## 2022-01-18 ENCOUNTER — Encounter: Payer: Self-pay | Admitting: Cardiology

## 2022-02-17 NOTE — Pre-Procedure Instructions (Signed)
Surgical Instructions    Your procedure is scheduled on Monday, February 27, 2022 at 9:20 AM.  Report to [facility] Main Entrance "A" at 7:30 A.M., then check in with the Admitting office.  Call this number if you have problems the morning of surgery:  [phone]   If you have any questions prior to your surgery date call [phone]: Open Monday-Friday 8am-4pm  *If you experience any cold or flu symptoms such as cough, fever, chills, shortness of breath, etc. between now and your scheduled surgery, please notify us.*    Remember:  Do not eat after midnight the night before your surgery  You may drink clear liquids until 6:30 AM the morning of your surgery.   Clear liquids allowed are: Water, Non-Citrus Juices (without pulp), Carbonated Beverages, Clear Tea, Black Coffee Only (NO MILK, CREAM OR POWDERED CREAMER of any kind), and Gatorade.  Please complete your PRE-SURGERY ENSURE that was provided to you by 6:30 AM the morning of surgery.  Please, if able, drink it in one setting. DO NOT SIP.     Take these medicines the morning of surgery with A SIP OF WATER:  diltiazem (CARDIZEM CD) Polyethyl Glycol-Propyl Glycol (SYSTANE) rosuvastatin (CRESTOR)  IF NEEDED: desloratadine (CLARINEX) fluticasone (FLONASE)  Follow your surgeon's instructions on when to stop apixaban (ELIQUIS).  If no instructions were given by your surgeon then you will need to call the office to get those instructions.    As of today, STOP taking any Aspirin (unless otherwise instructed by your surgeon) Aleve, Naproxen, Ibuprofen, Motrin, Advil, Goody's, BC's, all herbal medications, fish oil, and all vitamins.                     Do NOT Smoke (Tobacco/Vaping) for 24 hours prior to your procedure.  If you use a CPAP at night, you may bring your mask/headgear for your overnight stay.   Contacts, glasses, piercing's, hearing aid's, dentures or partials may not be worn into surgery, please bring cases for  these belongings.    For patients admitted to the hospital, discharge time will be determined by your treatment team.   Patients discharged the day of surgery will not be allowed to drive home, and someone needs to stay with them for 24 hours.  SURGICAL WAITING ROOM VISITATION Patients having surgery or a procedure may have two support people in the waiting area. Visitors may stay in the waiting area during the procedure and switch out with other visitors if needed. Children under the age of 14 must have an adult accompany them who is not the patient. If the patient needs to stay at the hospital during part of their recovery, the visitor guidelines for inpatient rooms apply.  Please refer to the [facility] website for the visitor guidelines for Inpatients (after your surgery is over and you are in a regular room).    Special instructions:   [place]- Preparing For Surgery  Before surgery, you can play an important role. Because skin is not sterile, your skin needs to be as free of germs as possible. You can reduce the number of germs on your skin by washing with CHG (chlorahexidine gluconate) Soap before surgery.  CHG is an antiseptic cleaner which kills germs and bonds with the skin to continue killing germs even after washing.    Oral Hygiene is also important to reduce your risk of infection.  Remember - BRUSH YOUR TEETH THE MORNING OF SURGERY WITH YOUR REGULAR TOOTHPASTE  Please  do not use if you have an allergy to CHG or antibacterial soaps. If your skin becomes reddened/irritated stop using the CHG.  Do not shave (including legs and underarms) for at least 48 hours prior to first CHG shower. It is OK to shave your face.  Please follow these instructions carefully.   Shower the NIGHT BEFORE SURGERY and the MORNING OF SURGERY  If you chose to wash your hair, wash your hair first as usual with your normal shampoo.  After you shampoo, rinse your hair and body thoroughly to remove  the shampoo.  Use CHG Soap as you would any other liquid soap. You can apply CHG directly to the skin and wash gently with a scrungie or a clean washcloth.   Apply the CHG Soap to your body ONLY FROM THE NECK DOWN.  Do not use on open wounds or open sores. Avoid contact with your eyes, ears, mouth and genitals (private parts). Wash Face and genitals (private parts)  with your normal soap.   Wash thoroughly, paying special attention to the area where your surgery will be performed.  Thoroughly rinse your body with warm water from the neck down.  DO NOT shower/wash with your normal soap after using and rinsing off the CHG Soap.  Pat yourself dry with a CLEAN TOWEL.  Wear CLEAN PAJAMAS to bed the night before surgery  Place CLEAN SHEETS on your bed the night before your surgery  DO NOT SLEEP WITH PETS.   Day of Surgery: Take a shower with CHG soap. Do not wear jewelry. Do not wear lotions, powders, perfumes/colognes, or deodorant. Do not shave 48 hours prior to surgery.  Men may shave face and neck. Do not bring valuables to the hospital.  [facility] is not responsible for any belongings or valuables. Wear Clean/Comfortable clothing the morning of surgery Do not apply any deodorants/lotions.   Remember to brush your teeth WITH YOUR REGULAR TOOTHPASTE.   Please read over the following fact sheets that you were given.  If you received a COVID test during your pre-op visit  it is requested that you wear a mask when out in public, stay away from anyone that may not be feeling well and notify your surgeon if you develop symptoms. If you have been in contact with anyone that has tested positive in the last 10 days please notify you surgeon.

## 2022-02-20 ENCOUNTER — Encounter (HOSPITAL_COMMUNITY): Payer: Self-pay

## 2022-02-20 ENCOUNTER — Encounter (HOSPITAL_COMMUNITY)
Admission: RE | Admit: 2022-02-20 | Discharge: 2022-02-20 | Disposition: A | Discharge status: Home / Self Care | Payer: Medicare Other | Source: Ambulatory Visit | Attending: General Surgery | Admitting: General Surgery

## 2022-02-20 ENCOUNTER — Other Ambulatory Visit: Payer: Self-pay

## 2022-02-20 VITALS — BP 148/98 | HR 55 | Temp 97.8°F | Resp 17 | Ht 71.0 in | Wt 163.1 lb

## 2022-02-20 DIAGNOSIS — Z01818 Encounter for other preprocedural examination: Secondary | ICD-10-CM | POA: Diagnosis not present

## 2022-02-20 DIAGNOSIS — I10 Essential (primary) hypertension: Secondary | ICD-10-CM

## 2022-02-20 HISTORY — DX: Cerebral infarction, unspecified: I63.9

## 2022-02-20 LAB — CBC
HCT: 34.2 % — ABNORMAL LOW (ref 39.0–52.0)
Hemoglobin: 11.4 g/dL — ABNORMAL LOW (ref 13.0–17.0)
MCH: 29.8 pg (ref 26.0–34.0)
MCHC: 33.3 g/dL (ref 30.0–36.0)
MCV: 89.5 fL (ref 80.0–100.0)
Platelets: 138 10*3/uL — ABNORMAL LOW (ref 150–400)
RBC: 3.82 MIL/uL — ABNORMAL LOW (ref 4.22–5.81)
RDW: 13.9 % (ref 11.5–15.5)
WBC: 3.7 10*3/uL — ABNORMAL LOW (ref 4.0–10.5)
nRBC: 0 % (ref 0.0–0.2)

## 2022-02-20 LAB — BASIC METABOLIC PANEL
Anion gap: 6 (ref 5–15)
BUN: 15 mg/dL (ref 8–23)
CO2: 28 mmol/L (ref 22–32)
Calcium: 9 mg/dL (ref 8.9–10.3)
Chloride: 108 mmol/L (ref 98–111)
Creatinine, Ser: 1.47 mg/dL — ABNORMAL HIGH (ref 0.61–1.24)
GFR, Estimated: 46 mL/min — ABNORMAL LOW (ref >60)
Glucose, Bld: 98 mg/dL (ref 70–99)
Potassium: 3.8 mmol/L (ref 3.5–5.1)
Sodium: 142 mmol/L (ref 135–145)

## 2022-02-20 NOTE — Progress Notes (Signed)
PCP - Dr. [name] Cardiologist - Dr. Clayton Lefort  PPM/ICD - Denies  Chest x-ray - N/A EKG - 07/18/21 Stress Test - 12/28/14 ECHO - 07/26/21 Cardiac Cath - denies  Sleep Study - Denies  Diabetes: Denies  Blood Thinner Instructions: Follow surgeon's instructions on when to stop Eliquis. Aspirin Instructions: N/A  ERAS Protcol - Yes, PRE-SURGERY Ensure  COVID TEST- N/A   Anesthesia review: Yes, cardiac hx  Patient denies shortness of breath, fever, cough and chest pain at PAT appointment   All instructions explained to the patient, with a verbal understanding of the material. Patient agrees to go over the instructions while at home for a better understanding. Patient also instructed to self quarantine after being tested for COVID-19. The opportunity to ask questions was provided.

## 2022-02-21 NOTE — Progress Notes (Signed)
Anesthesia Chart Review:  Follows with cardiologist Dr. Einar Gip for history of chronic A-fib on Eliquis, TIA and stroke in 2007, aortic root dilatation, HTN, HLD.  Cardiac clearance from Dr. Einar Gip in letter dated 01/18/2022 states, "Adrian Daniels is at low risk, from a cardiac standpoint, for his upcoming procedure: Inguinal hernia repair.  It is ok to proceed without further cardiac testing. If applicable can hold Eliquis for 2  day(s) prior to procedure and re-start 2  days post procedure."  Patient to hold Eliquis 2 days per surgery posting.  She has CKD 3.  Preop labs reviewed, creatinine elevated 1.47 consistent with history of CKD, mild anemia with hemoglobin 11.4, mild thrombocytopenia with platelets 138k, otherwise unremarkable.  EKG 07/18/2021: Atrial fibrillation with controlled ventricular response at rate of 68 bpm.    CHEST - 2 VIEW 12/05/21: COMPARISON:  Chest x-ray dated July 20, 2018   FINDINGS: Stable cardiomegaly. Stable prominence of the bilateral pulmonary arteries, which can be seen in the setting of pulmonary hypertension. Mild left basilar opacity, likely due to atelectasis. No focal consolidation. No pleural effusion or pneumothorax.   IMPRESSION: 1. No evidence of pneumonia. 2. Stable cardiomegaly.  Echocardiogram 07/26/2021:  Left ventricle cavity is normal in size. Moderate concentric hypertrophy  of the left ventricle. Normal global wall motion. Normal LV systolic  function with visual EF 55-60%. Unable to evaluate diastolic function due  to atrial fibrillation.  Left atrial cavity is mildly dilated.  Right atrial cavity is moderately dilated.  Right ventricle cavity is mildly dilated. Normal right ventricular  function.  Structurally normal trileaflet aortic valve.  Moderate (Grade II) aortic  regurgitation.  Mild (Grade I) mitral regurgitation.  Mild to moderate tricuspid regurgitation. Estimated pulmonary artery  systolic pressure 32 mmHg.  The aortic  root is dilated at 4.1 cm.  Personally reviewed and compared with previous study on 07/29/2020. No  significant change noted.  Exercise myoview stress 06/05/2018 [facility] CV):  1. The patient performed treadmill exercise using Bruce protocol, completing 5:07 minutes. The patient completed an estimated workload of 7 METS, reaching 92% of the maximum predicted heart rate. Exercise capacity was low.. Resting hypertension 142/106 mmHg, with exaggerated exercise response, peak BP reaching 220/80 mmHg. No stress symptoms reported. No ischemic changes seen on stress electrocardiogram.  2. The overall quality of the study is good. There is no evidence of abnormal lung activity. Stress and rest SPECT images demonstrate homogeneous tracer distribution throughout the myocardium. Gated SPECT imaging reveals normal myocardial thickening and wall motion. The left ventricular ejection fraction was calculated 48%, although visually appears normal.  3. Low risk study.     Adrian, Daniels [facility] Short Stay Center/Anesthesiology Phone [phone] 02/21/2022 1:37 PM

## 2022-02-21 NOTE — Anesthesia Preprocedure Evaluation (Addendum)
Anesthesia Evaluation  Patient identified by MRN, date of birth, ID band Patient awake    Reviewed: Allergy & Precautions, NPO status , Patient's Chart, lab work & pertinent test results  Airway Mallampati: II  TM Distance: >3 FB Neck ROM: Full    Dental  (+) Missing, Edentulous Upper   Pulmonary former smoker   Pulmonary exam normal        Cardiovascular hypertension, Pt. on medications Normal cardiovascular exam+ Valvular Problems/Murmurs AI      Neuro/Psych CVA  negative psych ROS   GI/Hepatic negative GI ROS,,,(+)     substance abuse    Endo/Other  negative endocrine ROS    Renal/GU Renal disease     Musculoskeletal negative musculoskeletal ROS (+)    Abdominal   Peds  Hematology  (+) Blood dyscrasia (On Eliquis), anemia   Anesthesia Other Findings RIGHT INGUINAL HERNIA  Reproductive/Obstetrics                             Anesthesia Physical Anesthesia Plan  ASA: 3  Anesthesia Plan: General   Post-op Pain Management:    Induction: Intravenous  PONV Risk Score and Plan: 2 and Ondansetron, Dexamethasone and Treatment may vary due to age or medical condition  Airway Management Planned: Oral ETT  Additional Equipment:   Intra-op Plan:   Post-operative Plan: Extubation in OR  Informed Consent: I have reviewed the patients History and Physical, chart, labs and discussed the procedure including the risks, benefits and alternatives for the proposed anesthesia with the patient or authorized representative who has indicated his/her understanding and acceptance.     Dental advisory given  Plan Discussed with: CRNA  Anesthesia Plan Comments: (PAT note by Karoline Caldwell, PA-C:  Follows with cardiologist Dr. Einar Gip for history of chronic A-fib on Eliquis, TIA and stroke in 2007, aortic root dilatation, HTN, HLD.  Cardiac clearance from Dr. Einar Gip in letter dated 01/18/2022 states,  "Elyn Aquas Clyburnis at low risk, from a cardiac standpoint, for hisupcoming procedure: Inguinal hernia repair. It is ok to proceed without further cardiac testing. If applicable can hold Eliquisfor 2 day(s) prior to procedure and re-start 2 days post procedure."  Patient to hold Eliquis 2 days per surgery posting.  She has CKD 3.  Preop labs reviewed, creatinine elevated 1.47 consistent with history of CKD, mild anemia with hemoglobin 11.4, mild thrombocytopenia with platelets 138k, otherwise unremarkable.  EKG 07/18/2021: Atrial fibrillation with controlled ventricular response at rate of 68 bpm.    CHEST - 2 VIEW 12/05/21: COMPARISON: Chest x-ray dated July 20, 2018  FINDINGS: Stable cardiomegaly. Stable prominence of the bilateral pulmonary arteries, which can be seen in the setting of pulmonary hypertension. Mild left basilar opacity, likely due to atelectasis. No focal consolidation. No pleural effusion or pneumothorax.  IMPRESSION: 1. No evidence of pneumonia. 2. Stable cardiomegaly.  Echocardiogram 07/26/2021:  Left ventricle cavity is normal in size. Moderate concentric hypertrophy  of the left ventricle. Normal global wall motion. Normal LV systolic  function with visual EF 55-60%. Unable to evaluate diastolic function due  to atrial fibrillation.  Left atrial cavity is mildly dilated.  Right atrial cavity is moderately dilated.  Right ventricle cavity is mildly dilated. Normal right ventricular  function.  Structurally normal trileaflet aortic valve. Moderate (Grade II) aortic  regurgitation.  Mild (Grade I) mitral regurgitation.  Mild to moderate tricuspid regurgitation. Estimated pulmonary artery  systolic pressure 32 mmHg.  The aortic root is  dilated at 4.1 cm.  Personally reviewed and compared with previous study on 07/29/2020. No  significant change noted.  Exercise myoview stress 06/05/2018([facility] CV):  1. The patient performed treadmill exercise using  Bruce protocol, completing 5:07 minutes. The patient completed an estimated workload of 7 METS, reaching 92% of the maximum predicted heart rate. Exercise capacity was low.. Resting hypertension 142/106 mmHg, with exaggerated exercise response, peak BP reaching 220/80 mmHg. No stress symptoms reported. No ischemic changes seen on stress electrocardiogram.  2. The overall quality of the study is good. There is no evidence of abnormal lung activity. Stress and rest SPECT images demonstrate homogeneous tracer distribution throughout the myocardium. Gated SPECT imaging reveals normal myocardial thickening and wall motion. The left ventricular ejection fraction was calculated 48%, although visually appears normal.  3. Low risk study.   )        Anesthesia Quick Evaluation

## 2022-02-27 ENCOUNTER — Encounter (HOSPITAL_COMMUNITY)
Admission: RE | Disposition: A | Discharge status: Home / Self Care | Payer: Self-pay | Source: Ambulatory Visit | Attending: General Surgery

## 2022-02-27 ENCOUNTER — Ambulatory Visit (HOSPITAL_COMMUNITY)
Admission: RE | Admit: 2022-02-27 | Discharge: 2022-02-27 | Disposition: A | Discharge status: Home / Self Care | Payer: Medicare Other | Source: Ambulatory Visit | Attending: General Surgery | Admitting: General Surgery

## 2022-02-27 ENCOUNTER — Ambulatory Visit (HOSPITAL_BASED_OUTPATIENT_CLINIC_OR_DEPARTMENT_OTHER): Payer: Medicare Other | Admitting: Certified Registered Nurse Anesthetist

## 2022-02-27 ENCOUNTER — Ambulatory Visit (HOSPITAL_COMMUNITY): Payer: Medicare Other | Admitting: Physician Assistant

## 2022-02-27 ENCOUNTER — Other Ambulatory Visit: Payer: Self-pay

## 2022-02-27 DIAGNOSIS — Z7901 Long term (current) use of anticoagulants: Secondary | ICD-10-CM | POA: Diagnosis not present

## 2022-02-27 DIAGNOSIS — Z87891 Personal history of nicotine dependence: Secondary | ICD-10-CM

## 2022-02-27 DIAGNOSIS — I10 Essential (primary) hypertension: Secondary | ICD-10-CM | POA: Insufficient documentation

## 2022-02-27 DIAGNOSIS — D176 Benign lipomatous neoplasm of spermatic cord: Secondary | ICD-10-CM

## 2022-02-27 DIAGNOSIS — D649 Anemia, unspecified: Secondary | ICD-10-CM | POA: Diagnosis not present

## 2022-02-27 DIAGNOSIS — K4090 Unilateral inguinal hernia, without obstruction or gangrene, not specified as recurrent: Secondary | ICD-10-CM

## 2022-02-27 HISTORY — PX: INSERTION OF MESH: SHX5868

## 2022-02-27 HISTORY — PX: INGUINAL HERNIA REPAIR: SHX194

## 2022-02-27 SURGERY — REPAIR, HERNIA, INGUINAL, LAPAROSCOPIC
Anesthesia: General | Site: Inguinal | Laterality: Right

## 2022-02-27 MED ORDER — FENTANYL CITRATE (PF) 100 MCG/2ML IJ SOLN: 25.0000 ug | INTRAMUSCULAR | Status: DC | PRN

## 2022-02-27 MED ORDER — CEFAZOLIN SODIUM-DEXTROSE 2-4 GM/100ML-% IV SOLN
2.0000 g | INTRAVENOUS | Status: AC
  Administered 2022-02-27: 2 g via INTRAVENOUS
  Filled 2022-02-27: qty 100

## 2022-02-27 MED ORDER — GLYCOPYRROLATE PF 0.2 MG/ML IJ SOSY
PREFILLED_SYRINGE | INTRAMUSCULAR | Status: DC | PRN
  Administered 2022-02-27: .1 mg via INTRAVENOUS

## 2022-02-27 MED ORDER — LIDOCAINE 2% (20 MG/ML) 5 ML SYRINGE
INTRAMUSCULAR | Status: AC
  Filled 2022-02-27: qty 5

## 2022-02-27 MED ORDER — AMISULPRIDE (ANTIEMETIC) 5 MG/2ML IV SOLN: 10.0000 mg | Freq: Once | INTRAVENOUS | Status: DC | PRN

## 2022-02-27 MED ORDER — LIDOCAINE 2% (20 MG/ML) 5 ML SYRINGE
INTRAMUSCULAR | Status: DC | PRN
  Administered 2022-02-27: 60 mg via INTRAVENOUS

## 2022-02-27 MED ORDER — ACETAMINOPHEN 500 MG PO TABS
1000.0000 mg | ORAL_TABLET | ORAL | Status: AC
  Administered 2022-02-27: 1000 mg via ORAL
  Filled 2022-02-27: qty 2

## 2022-02-27 MED ORDER — EPHEDRINE SULFATE-NACL 50-0.9 MG/10ML-% IV SOSY
PREFILLED_SYRINGE | INTRAVENOUS | Status: DC | PRN
  Administered 2022-02-27: 5 mg via INTRAVENOUS
  Administered 2022-02-27 (×2): 10 mg via INTRAVENOUS

## 2022-02-27 MED ORDER — ROCURONIUM BROMIDE 10 MG/ML (PF) SYRINGE
PREFILLED_SYRINGE | INTRAVENOUS | Status: DC | PRN
  Administered 2022-02-27: 40 mg via INTRAVENOUS

## 2022-02-27 MED ORDER — TRAMADOL HCL 50 MG PO TABS: 50.0000 mg | ORAL_TABLET | Freq: Four times a day (QID) | ORAL | 0 refills | Status: AC | PRN

## 2022-02-27 MED ORDER — CHLORHEXIDINE GLUCONATE CLOTH 2 % EX PADS: 6.0000 | MEDICATED_PAD | Freq: Once | CUTANEOUS | Status: DC

## 2022-02-27 MED ORDER — LACTATED RINGERS IV SOLN: INTRAVENOUS | Status: DC

## 2022-02-27 MED ORDER — DEXAMETHASONE SODIUM PHOSPHATE 10 MG/ML IJ SOLN
INTRAMUSCULAR | Status: DC | PRN
  Administered 2022-02-27: 10 mg via INTRAVENOUS

## 2022-02-27 MED ORDER — ORAL CARE MOUTH RINSE: 15.0000 mL | Freq: Once | OROMUCOSAL | Status: AC

## 2022-02-27 MED ORDER — PROPOFOL 10 MG/ML IV BOLUS
INTRAVENOUS | Status: AC
  Filled 2022-02-27: qty 20

## 2022-02-27 MED ORDER — FENTANYL CITRATE (PF) 250 MCG/5ML IJ SOLN
INTRAMUSCULAR | Status: DC | PRN
  Administered 2022-02-27: 150 ug via INTRAVENOUS

## 2022-02-27 MED ORDER — CHLORHEXIDINE GLUCONATE 0.12 % MT SOLN
15.0000 mL | Freq: Once | OROMUCOSAL | Status: AC
  Administered 2022-02-27: 15 mL via OROMUCOSAL
  Filled 2022-02-27: qty 15

## 2022-02-27 MED ORDER — 0.9 % SODIUM CHLORIDE (POUR BTL) OPTIME
TOPICAL | Status: DC | PRN
  Administered 2022-02-27: 1000 mL

## 2022-02-27 MED ORDER — PROPOFOL 10 MG/ML IV BOLUS
INTRAVENOUS | Status: DC | PRN
  Administered 2022-02-27: 100 mg via INTRAVENOUS

## 2022-02-27 MED ORDER — SUGAMMADEX SODIUM 200 MG/2ML IV SOLN
INTRAVENOUS | Status: DC | PRN
  Administered 2022-02-27: 200 mg via INTRAVENOUS

## 2022-02-27 MED ORDER — ENSURE PRE-SURGERY PO LIQD: 296.0000 mL | Freq: Once | ORAL | Status: DC

## 2022-02-27 MED ORDER — BUPIVACAINE-EPINEPHRINE (PF) 0.25% -1:200000 IJ SOLN
INTRAMUSCULAR | Status: DC | PRN
  Administered 2022-02-27: 7 mL

## 2022-02-27 MED ORDER — BUPIVACAINE-EPINEPHRINE (PF) 0.25% -1:200000 IJ SOLN
INTRAMUSCULAR | Status: AC
  Filled 2022-02-27: qty 30

## 2022-02-27 MED ORDER — PHENYLEPHRINE 80 MCG/ML (10ML) SYRINGE FOR IV PUSH (FOR BLOOD PRESSURE SUPPORT)
PREFILLED_SYRINGE | INTRAVENOUS | Status: AC
  Filled 2022-02-27: qty 10

## 2022-02-27 MED ORDER — FENTANYL CITRATE (PF) 250 MCG/5ML IJ SOLN
INTRAMUSCULAR | Status: AC
  Filled 2022-02-27: qty 5

## 2022-02-27 MED ORDER — ROCURONIUM BROMIDE 10 MG/ML (PF) SYRINGE
PREFILLED_SYRINGE | INTRAVENOUS | Status: AC
  Filled 2022-02-27: qty 10

## 2022-02-27 MED ORDER — ONDANSETRON HCL 4 MG/2ML IJ SOLN
INTRAMUSCULAR | Status: DC | PRN
  Administered 2022-02-27: 4 mg via INTRAVENOUS

## 2022-02-27 MED ORDER — ONDANSETRON HCL 4 MG/2ML IJ SOLN: 4.0000 mg | Freq: Once | INTRAMUSCULAR | Status: DC | PRN

## 2022-02-27 SURGICAL SUPPLY — 41 items
ADH SKN CLS APL DERMABOND .7 (GAUZE/BANDAGES/DRESSINGS) ×1
BAG COUNTER SPONGE SURGICOUNT (BAG) ×2 IMPLANT
BAG SPNG CNTER NS LX DISP (BAG) ×1
CANISTER SUCT 3000ML PPV (MISCELLANEOUS) IMPLANT
COVER SURGICAL LIGHT HANDLE (MISCELLANEOUS) ×2 IMPLANT
DERMABOND ADVANCED .7 DNX12 (GAUZE/BANDAGES/DRESSINGS) ×2 IMPLANT
ELECT REM PT RETURN 9FT ADLT (ELECTROSURGICAL) ×1
ELECTRODE REM PT RTRN 9FT ADLT (ELECTROSURGICAL) ×2 IMPLANT
GLOVE BIO SURGEON STRL SZ7.5 (GLOVE) ×2 IMPLANT
GLOVE SURG SYN 7.5  E (GLOVE) ×1
GLOVE SURG SYN 7.5 E (GLOVE) ×1 IMPLANT
GLOVE SURG SYN 7.5 PF PI (GLOVE) ×2 IMPLANT
GOWN STRL REUS W/ TWL LRG LVL3 (GOWN DISPOSABLE) ×4 IMPLANT
GOWN STRL REUS W/ TWL XL LVL3 (GOWN DISPOSABLE) ×2 IMPLANT
GOWN STRL REUS W/TWL LRG LVL3 (GOWN DISPOSABLE) ×2
GOWN STRL REUS W/TWL XL LVL3 (GOWN DISPOSABLE) ×1
KIT BASIN OR (CUSTOM PROCEDURE TRAY) ×2 IMPLANT
KIT TURNOVER KIT B (KITS) ×2 IMPLANT
MESH 3DMAX 5X7 RT XLRG (Mesh General) IMPLANT
NDL INSUFFLATION 14GA 120MM (NEEDLE) IMPLANT
NEEDLE INSUFFLATION 14GA 120MM (NEEDLE) ×1 IMPLANT
NS IRRIG 1000ML POUR BTL (IV SOLUTION) ×2 IMPLANT
PAD ARMBOARD 7.5X6 YLW CONV (MISCELLANEOUS) ×4 IMPLANT
RELOAD STAPLE 4.0 BLU F/HERNIA (INSTRUMENTS) IMPLANT
RELOAD STAPLE HERNIA 4.0 BLUE (INSTRUMENTS) ×1 IMPLANT
SCISSORS LAP 5X35 DISP (ENDOMECHANICALS) ×2 IMPLANT
SET TUBE SMOKE EVAC HIGH FLOW (TUBING) ×2 IMPLANT
STAPLER HERNIA 12 8.5 360D (INSTRUMENTS) IMPLANT
SUT MNCRL AB 4-0 PS2 18 (SUTURE) ×2 IMPLANT
SUT VIC AB 1 CT1 27 (SUTURE) ×1
SUT VIC AB 1 CT1 27XBRD ANBCTR (SUTURE) IMPLANT
SYRINGE TOOMEY DISP (SYRINGE) ×2 IMPLANT
TOWEL GREEN STERILE (TOWEL DISPOSABLE) ×2 IMPLANT
TOWEL GREEN STERILE FF (TOWEL DISPOSABLE) ×2 IMPLANT
TRAY FOLEY W/BAG SLVR 14FR (SET/KITS/TRAYS/PACK) ×2 IMPLANT
TRAY LAPAROSCOPIC MC (CUSTOM PROCEDURE TRAY) ×2 IMPLANT
TROCAR OPTICAL SHORT 5MM (TROCAR) ×2 IMPLANT
TROCAR OPTICAL SLV SHORT 5MM (TROCAR) ×2 IMPLANT
TROCAR XCEL 12X100 BLDLESS (ENDOMECHANICALS) ×2 IMPLANT
WARMER LAPAROSCOPE (MISCELLANEOUS) ×2 IMPLANT
WATER STERILE IRR 1000ML POUR (IV SOLUTION) ×2 IMPLANT

## 2022-02-27 NOTE — Anesthesia Procedure Notes (Signed)
Procedure Name: Intubation Date/Time: 02/27/2022 9:09 AM  Performed by: Darletta Moll, CRNAPre-anesthesia Checklist: Patient identified, Emergency Drugs available, Suction available and Patient being monitored Patient Re-evaluated:Patient Re-evaluated prior to induction Oxygen Delivery Method: Circle system utilized Preoxygenation: Pre-oxygenation with 100% oxygen Induction Type: IV induction Ventilation: Mask ventilation without difficulty Laryngoscope Size: Mac and 4 Tube type: Oral Tube size: 7.5 mm Number of attempts: 1 Airway Equipment and Method: Stylet Placement Confirmation: ETT inserted through vocal cords under direct vision, positive ETCO2 and breath sounds checked- equal and bilateral Secured at: 23 cm Tube secured with: Tape Dental Injury: Teeth and Oropharynx as per pre-operative assessment  Comments: Performed by K venrick SRNA

## 2022-02-27 NOTE — Discharge Instructions (Signed)
[facility] _______[facility] ? ?INGUINAL HERNIA REPAIR: POST OP INSTRUCTIONS ? ?Always review your discharge instruction sheet given to you by the facility where your surgery was performed. ?IF YOU HAVE DISABILITY OR FAMILY LEAVE FORMS, YOU MUST BRING THEM TO THE OFFICE FOR PROCESSING.   ?DO NOT GIVE THEM TO YOUR DOCTOR. ? ?1. A  prescription for pain medication may be given to you upon discharge.  Take your pain medication as prescribed, if needed.  If narcotic pain medicine is not needed, then you may take acetaminophen (Tylenol) or ibuprofen (Advil) as needed. ?2. Take your usually prescribed medications unless otherwise directed. ?If you need a refill on your pain medication, please contact your pharmacy.  They will contact our office to request authorization. Prescriptions will not be filled after 5 pm or on week-ends. ?3. You should follow a light diet the first 24 hours after arrival home, such as soup and crackers, etc.  Be sure to include lots of fluids daily.  Resume your normal diet the day after surgery. ?4.Most patients will experience some swelling and bruising around the umbilicus or in the groin and scrotum.  Ice packs and reclining will help.  Swelling and bruising can take several days to resolve.  ?6. It is common to experience some constipation if taking pain medication after surgery.  Increasing fluid intake and taking a stool softener (such as Colace) will usually help or prevent this problem from occurring.  A mild laxative (Milk of Magnesia or Miralax) should be taken according to package directions if there are no bowel movements after 48 hours. ?7. Unless discharge instructions indicate otherwise, you may remove your bandages 24-48 hours after surgery, and you may shower at that time.  You may have steri-strips (small skin tapes) in place directly over the incision.  These strips should be left on the skin for 7-10 days.  If your surgeon used skin glue on the incision, you may  shower in 24 hours.  The glue will flake off over the next 2-3 weeks.  Any sutures or staples will be removed at the office during your follow-up visit. ?8. ACTIVITIES:  You may resume regular (light) daily activities beginning the next day--such as daily self-care, walking, climbing stairs--gradually increasing activities as tolerated.  You may have sexual intercourse when it is comfortable.  Refrain from any heavy lifting or straining until approved by your doctor. ? ?a.You may drive when you are no longer taking prescription pain medication, you can comfortably wear a seatbelt, and you can safely maneuver your car and apply brakes. ?b.RETURN TO WORK:   ?_____________________________________________ ? ?9.You should see your doctor in the office for a follow-up appointment approximately 2-3 weeks after your surgery.  Make sure that you call for this appointment within a day or two after you arrive home to insure a convenient appointment time. ?10.OTHER INSTRUCTIONS: _________________________ ?   _____________________________________ ? ?WHEN TO CALL YOUR DOCTOR: ?Fever over 101.0 ?Inability to urinate ?Nausea and/or vomiting ?Extreme swelling or bruising ?Continued bleeding from incision. ?Increased pain, redness, or drainage from the incision ? ?The clinic staff is available to answer your questions during regular business hours.  Please don?t hesitate to call and ask to speak to one of the nurses for clinical concerns.  If you have a medical emergency, go to the nearest emergency room or call 911.  A surgeon from [facility] is always on call at the hospital ? ? ?[address], [address], [place], [place]    [address] ? ? [address] [address], [place], [place]   [address] ?[phone] ? [phone] ? FAX [phone] ?Web site: [email or web address] ? ?

## 2022-02-27 NOTE — Op Note (Addendum)
Procedure(s): LAPAROSCOPIC RIGHT INGUINAL HERNIA REPAIR INSERTION OF MESH Procedure Note  Adrian Daniels male 87 y.o. 02/27/2022  Procedure(s) and Anesthesia Type:    * LAPAROSCOPIC RIGHT INGUINAL HERNIA REPAIR - General    * INSERTION OF MESH - General  Surgeon(s) and Role:    Ralene Ok, MD - Primary      Surgeon: Ralene Ok   Assistants: Dr. Lossie Faes, PGY-5  Anesthesia: General endotracheal anesthesia  ASA Class: 3    Procedure Detail  LAPAROSCOPIC RIGHT INGUINAL HERNIA REPAIR, INSERTION OF MESH  Findings: Large indirect inguinal hernia, direct weakness  Counts: reported as correct x 2  Findings:  The patient had a large right indirect hernia and a moderated sized cord lipoma  Indications for procedure:  The patient is a 87 year old male with a right inguinal hernia for several months. Patient complained of symptomatology to his right inguinal area. The patient was taken back for elective inguinal hernia repair.  Details of the procedure: The patient was taken back to the operating room. The patient was placed in supine position with bilateral SCDs in place.  The patient was prepped and draped in the usual sterile fashion.  After appropriate anitbiotics were confirmed, a time-out was confirmed and all facts were verified.  0.25% Marcaine was used to infiltrate the umbilical area. A 11-blade was used to cut down the skin and blunt dissection was used to get the anterior fashion.  The anterior fascia was incised approximately 1 cm and the muscles were retracted laterally. Blunt dissection was then used to create a space in the preperitoneal area. At this time a 10 mm camera was then introduced into the space and advanced the pubic tubercle and a 12 mm trocar was placed over this and insufflation was started.  There was a tear in the peritoneum.  A veress needle was used to help desufflate the pneumoperitoneum.  At this time a space was created from medial to  laterally the preperitoneal space.  Cooper's ligament was initially cleaned off.  The hernia sac was identified in the indirect space. Dissection of the hernia sac and cord structures was undertaken the vas deferens was identified and protected in all parts of the case.    Once the hernia sac was taken down to approximately the umbilicus a Bard 3D Max mesh, size: [name], was  introduced into the preperitoneal space.  The mesh was brought over to cover the direct and indirect hernia spaces.  This was anchored into place and secured to Cooper's ligament with 4.76m staples from a [place] hernia stapler. It was anchored to the anterior abdominal wall with 4.8 mm staples. The hernia sac was seen lying posterior to the mesh. There was no staples placed laterally. The tear in the peritoneum was stapled closed.  The insufflation was evacuated and the peritoneum was seen posterior to the mesh. The trochars were removed. The anterior fascia was reapproximated using #1 Vicryl on a UR- 6.  Intra-abdominal air was evacuated and the Veress needle removed. The skin was reapproximated using 4-0 Monocryl subcuticular fashion and Dermabond. The patient was awakened from general anesthesia and taken to recovery in stable condition.  I was personally present during the key and critical portions of this procedure and immediately available throughout the entire procedure, as documented in my operative note.   Estimated Blood Loss:  Minimal         Drains:  none         Total IV Fluids: per  anesthesia  Blood Given: none          Specimens: none         Implants: Bard 3D MAX Right sided mesh        Complications:  * No complications entered in OR log *         Disposition: PACU - hemodynamically stable.         Condition: stable

## 2022-02-27 NOTE — H&P (Signed)
Chief Complaint: Follow-up       History of Present Illness: Adrian Daniels is a 87 y.o. male who is seen today as an office consultation at the request of Dr. Pasty Arch for evaluation of Follow-up .   Patient follows up today for right inguinal hernia.   Patient previously been seen secondary to right inguinal hernia however for had his surgery scheduled but was canceled secondary to stroke symptoms.   Patient was evaluated by neurology.  At this point he is 6 months out and they feel he is okay for surgery at this point with an acceptable risk.  Patient currently on Eliquis is being managed by Dr. Einar Gip.   We will reach out to Dr. Nadyne Coombes for clearance to be off of his Eliquis.   Patient states he had no further symptoms from his hernia.         Review of Systems: A complete review of systems was obtained from the patient.  I have reviewed this information and discussed as appropriate with the patient.  See HPI as well for other ROS.   Review of Systems  Constitutional:  Negative for fever.  HENT:  Negative for congestion.   Eyes:  Negative for blurred vision.  Respiratory:  Negative for cough, shortness of breath and wheezing.   Cardiovascular:  Negative for chest pain and palpitations.  Gastrointestinal:  Negative for heartburn.  Genitourinary:  Negative for dysuria.  Musculoskeletal:  Negative for myalgias.  Skin:  Negative for rash.  Neurological:  Negative for dizziness and headaches.  Psychiatric/Behavioral:  Negative for depression and suicidal ideas.   All other systems reviewed and are negative.      Medical History: Past Medical History Past Medical History: Diagnosis        Date            History of stroke                       Hyperlipidemia                         Hypertension           There is no problem list on file for this patient.     Past Surgical History Past Surgical History: Procedure       Laterality         Date            hernia surgery                      Allergies No Known Allergies     Current Outpatient Medications on File Prior to Visit Medication       Sig       Dispense         Refill            apixaban (ELIQUIS) 2.5 mg tablet     Take 1 tablet by mouth 2 (two) times daily                              desloratadine (CLARINEX) 5 mg tablet         1 tablet                                     diltiazem (CARDIZEM CD)  300 MG CD capsule       Take 1 capsule by mouth once daily                                ezetimibe (ZETIA) 10 mg tablet          1 tablet                                     losartan-hydrochlorothiazide (HYZAAR) 100-12.5 mg tablet Take 1 tablet by mouth once daily                                 rosuvastatin (CRESTOR) 20 MG tablet         1 tablet                                     tamsulosin (FLOMAX) 0.4 mg capsule          1 capsule 30 minutes after the same meal each day                           No current facility-administered medications on file prior to visit.     Family History History reviewed. No pertinent family history.     Social History   Tobacco Use Smoking Status           Never Smokeless Tobacco   Never     Social History Social History     Socioeconomic History            Marital status:  Single Tobacco Use            Smoking status:          Never            Smokeless tobacco:    Never Vaping Use            Vaping Use:    Never used Substance and Sexual Activity            Alcohol use:    Never            Drug use:        Never       Objective:   BP 132/78   Pulse 68   Temp (!) 97.5 F (36.4 C) (Oral)   Resp 18   Ht '5\' 11"'$  (1.803 m)   Wt 74.4 kg   SpO2 99%   BMI 22.87 kg/m    There is no height or weight on file to calculate BMI. Physical Exam Constitutional:      Appearance: Normal appearance.  HENT:     Head: Normocephalic and atraumatic.     Nose: Nose normal. No congestion.     Mouth/Throat:     Mouth: Mucous membranes are moist.      Pharynx: Oropharynx is clear.  Eyes:     Pupils: Pupils are equal, round, and reactive to light.  Cardiovascular:     Rate and Rhythm: Normal rate and regular rhythm.     Pulses: Normal pulses.     Heart sounds: Normal heart sounds. No murmur heard.   No friction rub. No gallop.  Pulmonary:  Effort: Pulmonary effort is normal. No respiratory distress.     Breath sounds: Normal breath sounds. No stridor. No wheezing, rhonchi or rales.  Abdominal:     General: Abdomen is flat.     Hernia: A hernia is present. Hernia is present in the right inguinal area.  Musculoskeletal:        General: Normal range of motion.     Cervical back: Normal range of motion.  Skin:    General: Skin is warm and dry.  Neurological:     General: No focal deficit present.     Mental Status: He is alert and oriented to person, place, and time.  Psychiatric:        Mood and Affect: Mood normal.        Thought Content: Thought content normal.        Assessment and Plan: Diagnoses and all orders for this visit:   Non-recurrent unilateral inguinal hernia without obstruction or gangrene     Adrian Daniels is a 87 y.o. male    1.          We will proceed to the OR for a laparoscopic right inguinal hernia repair with mesh. 2.         All risks and benefits were discussed with the patient, to generally include infection, bleeding, damage to surrounding structures, acute and chronic nerve pain, and recurrence. Alternatives were offered and described.  All questions were answered and the patient voiced understanding of the procedure and wishes to proceed at this point.             No follow-ups on file.   Ralene Ok, MD, [facility], [facility]

## 2022-02-27 NOTE — Transfer of Care (Signed)
Immediate Anesthesia Transfer of Care Note  Patient: [name]  Procedure(s) Performed: LAPAROSCOPIC RIGHT INGUINAL HERNIA REPAIR (Right: Inguinal) INSERTION OF MESH (Right: Inguinal)  Patient Location: PACU  Anesthesia Type:General  Level of Consciousness: drowsy and patient cooperative  Airway & Oxygen Therapy: Patient Spontanous Breathing  Post-op Assessment: Report given to RN, Post -op Vital signs reviewed and stable and Patient moving all extremities X 4  Post vital signs: Reviewed and stable  Last Vitals:  Vitals Value Taken Time  BP 127/69 02/27/22 1015  Temp    Pulse 73 02/27/22 1017  Resp 13 02/27/22 1017  SpO2 97 % 02/27/22 1017  Vitals shown include unvalidated device data.  Last Pain:  Vitals:   02/27/22 0814  TempSrc:   PainSc: 0-No pain         Complications: No notable events documented.

## 2022-02-27 NOTE — Anesthesia Postprocedure Evaluation (Signed)
Anesthesia Post Note  Patient: Adrian Daniels  Procedure(s) Performed: LAPAROSCOPIC RIGHT INGUINAL HERNIA REPAIR (Right: Inguinal) INSERTION OF MESH (Right: Inguinal)     Patient location during evaluation: PACU Anesthesia Type: General Level of consciousness: awake Pain management: pain level controlled Vital Signs Assessment: post-procedure vital signs reviewed and stable Respiratory status: spontaneous breathing, nonlabored ventilation, respiratory function stable and patient connected to nasal cannula oxygen Cardiovascular status: blood pressure returned to baseline and stable Postop Assessment: no apparent nausea or vomiting Anesthetic complications: no   No notable events documented.  Last Vitals:  Vitals:   02/27/22 1030 02/27/22 1045  BP: 130/84 111/66  Pulse: 63 62  Resp: 12 12  Temp:  36.4 C  SpO2: 97% 96%    Last Pain:  Vitals:   02/27/22 1045  TempSrc:   PainSc: 0-No pain                 [name]

## 2022-02-28 ENCOUNTER — Encounter (HOSPITAL_COMMUNITY): Payer: Self-pay | Admitting: General Surgery

## 2022-03-13 DIAGNOSIS — Z23 Encounter for immunization: Secondary | ICD-10-CM | POA: Diagnosis not present

## 2022-03-13 DIAGNOSIS — I129 Hypertensive chronic kidney disease with stage 1 through stage 4 chronic kidney disease, or unspecified chronic kidney disease: Secondary | ICD-10-CM | POA: Diagnosis not present

## 2022-03-13 DIAGNOSIS — E785 Hyperlipidemia, unspecified: Secondary | ICD-10-CM | POA: Diagnosis not present

## 2022-03-13 DIAGNOSIS — N1832 Chronic kidney disease, stage 3b: Secondary | ICD-10-CM | POA: Diagnosis not present

## 2022-03-13 DIAGNOSIS — Z8673 Personal history of transient ischemic attack (TIA), and cerebral infarction without residual deficits: Secondary | ICD-10-CM | POA: Diagnosis not present

## 2022-03-13 DIAGNOSIS — R413 Other amnesia: Secondary | ICD-10-CM | POA: Diagnosis not present

## 2022-04-03 ENCOUNTER — Encounter (HOSPITAL_COMMUNITY): Payer: Self-pay | Admitting: General Surgery

## 2022-04-04 DIAGNOSIS — Z23 Encounter for immunization: Secondary | ICD-10-CM | POA: Diagnosis not present

## 2022-07-20 ENCOUNTER — Ambulatory Visit: Payer: PRIVATE HEALTH INSURANCE | Admitting: Cardiology

## 2022-11-13 IMAGING — CT CT ABD-PELV W/ CM
2 of 5 series · 12 of 46 positions shown, 14 images · IV contrast (agent unspecified)
Comparison: 06/02/2015

CLINICAL DATA: Unexplained weight loss.

EXAM:
CT ABDOMEN AND PELVIS WITH CONTRAST
TECHNIQUE: Multidetector CT imaging of the abdomen and pelvis was performed
using the standard protocol following bolus administration of
intravenous contrast.

[Series 2: abd pelvis 5.00 br40 s3 axial · axial · 0.52mm/px · z∈[+1210,+1625]mm · 9 of 100 slices shown, 11 images]
[im 11/100  soft-tissue]
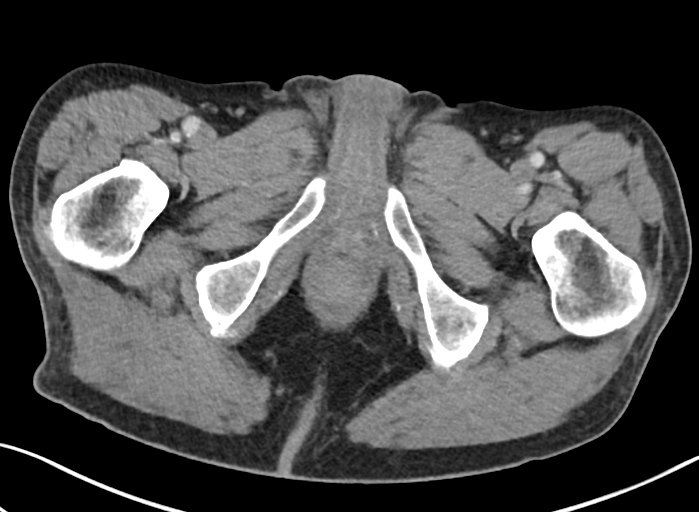
[im 11/100  bone]
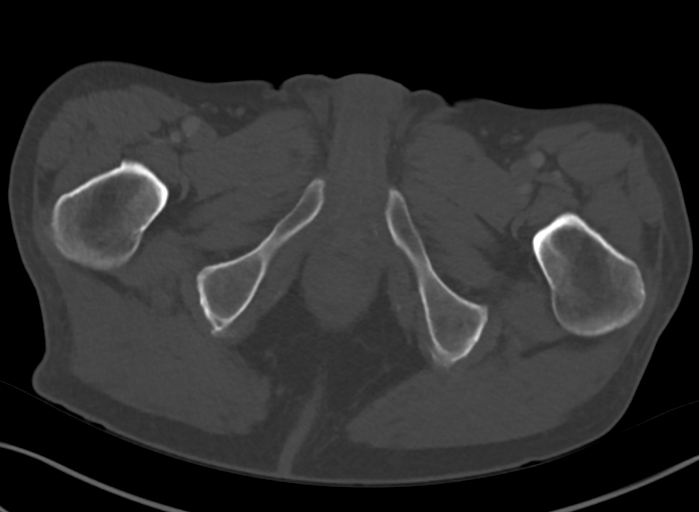
[im 21/100  soft-tissue]
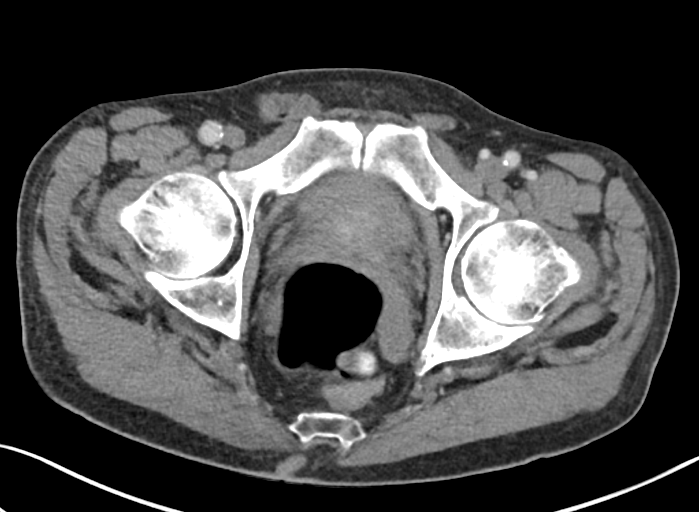
[im 32/100  soft-tissue]
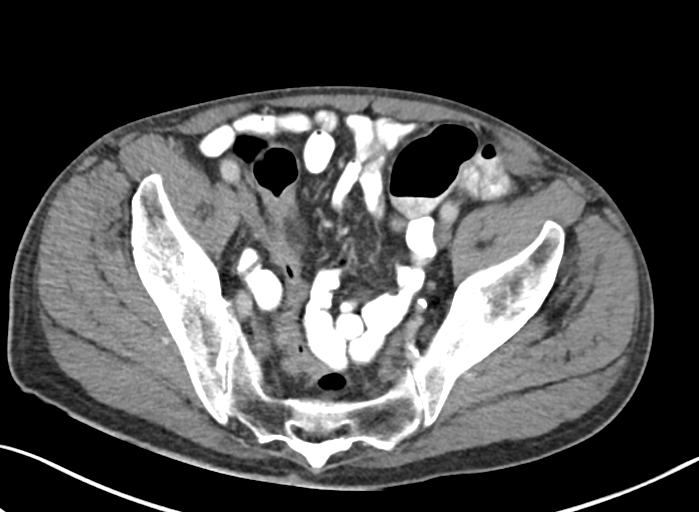
[im 42/100  soft-tissue]
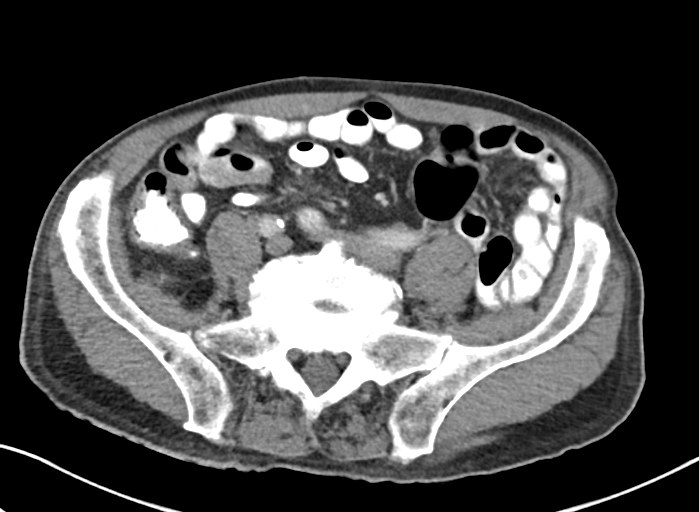
[im 53/100  soft-tissue]
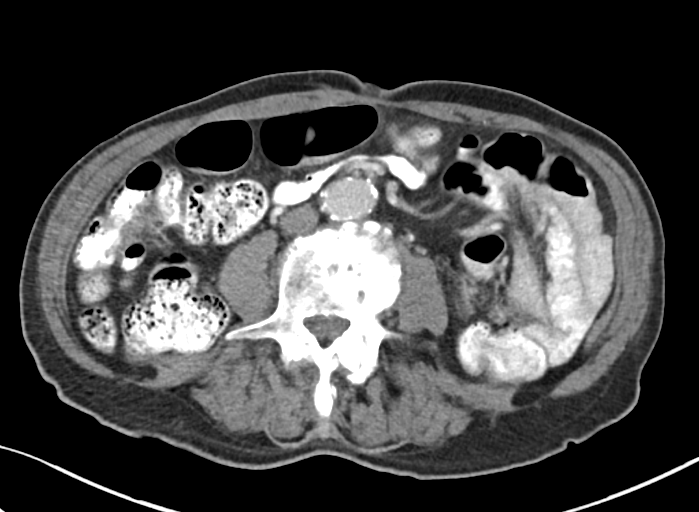
[im 63/100  soft-tissue]
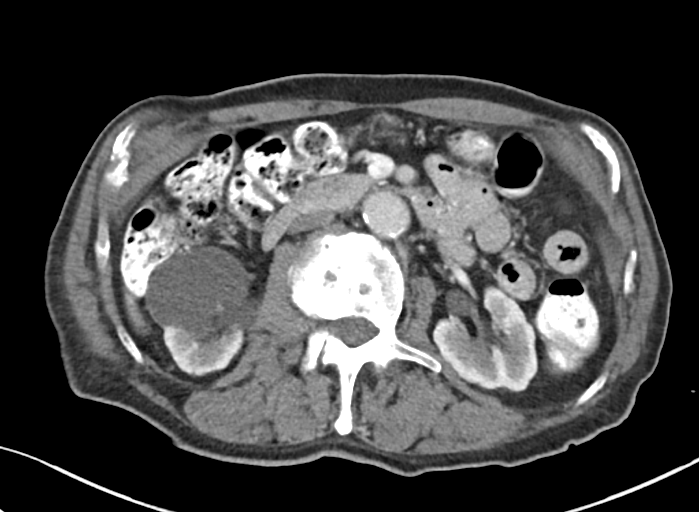
[im 73/100  soft-tissue]
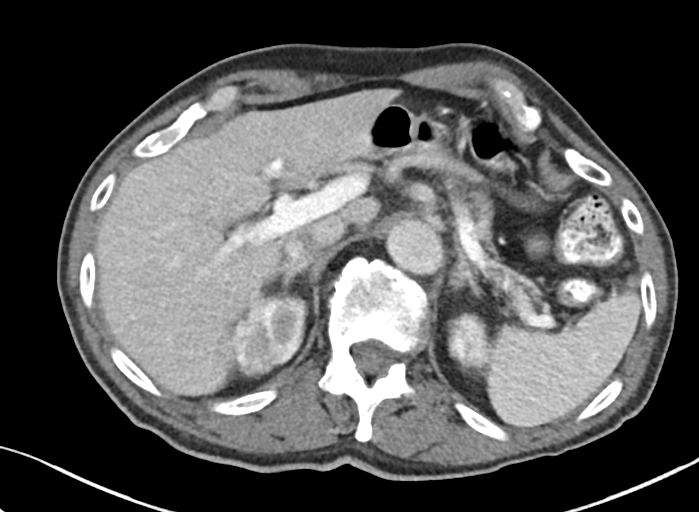
[im 84/100  soft-tissue]
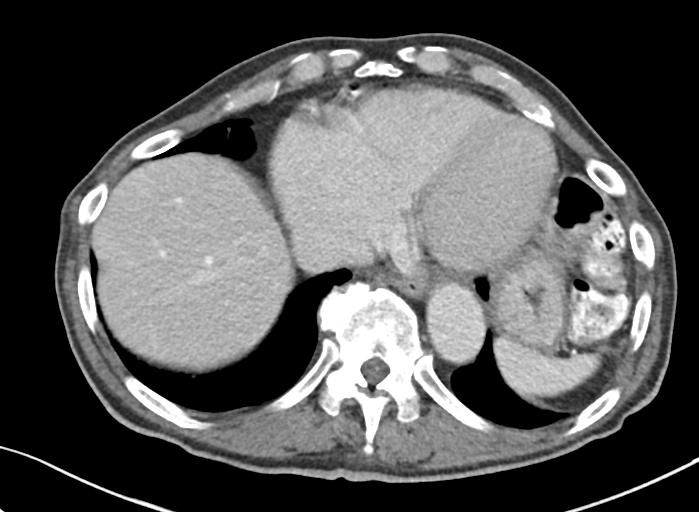
[im 94/100  soft-tissue]
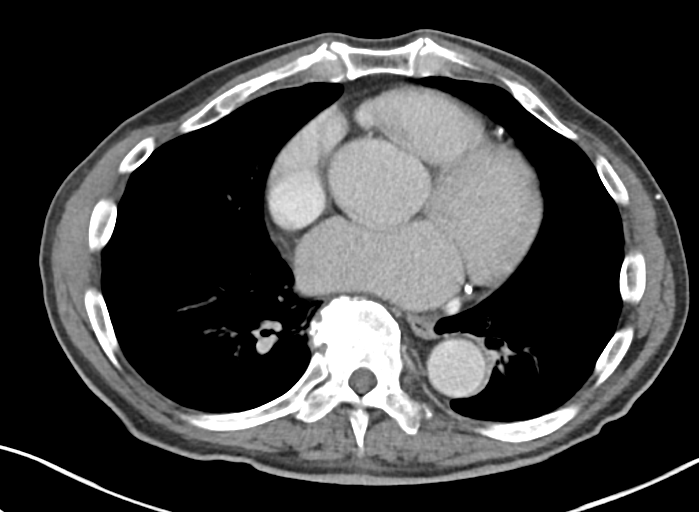
[im 94/100  bone]
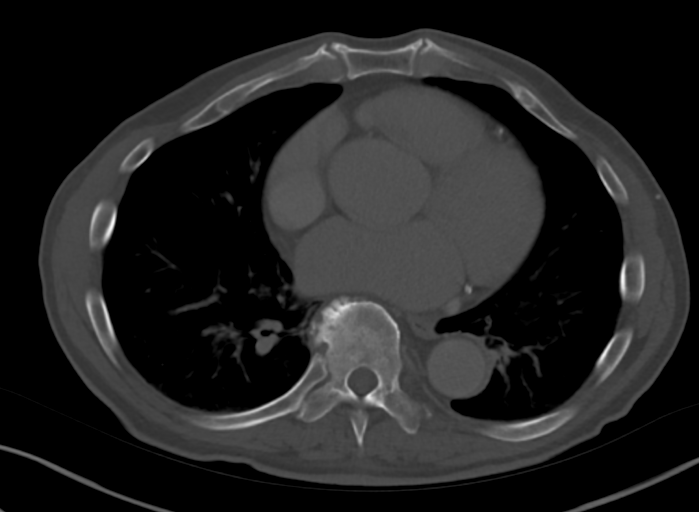

[Series 6: abd pelvis 2.00 br40 s3 cor · coronal · 0.71mm/px · 3 of 132 slices shown]
[im 44/132  soft-tissue]
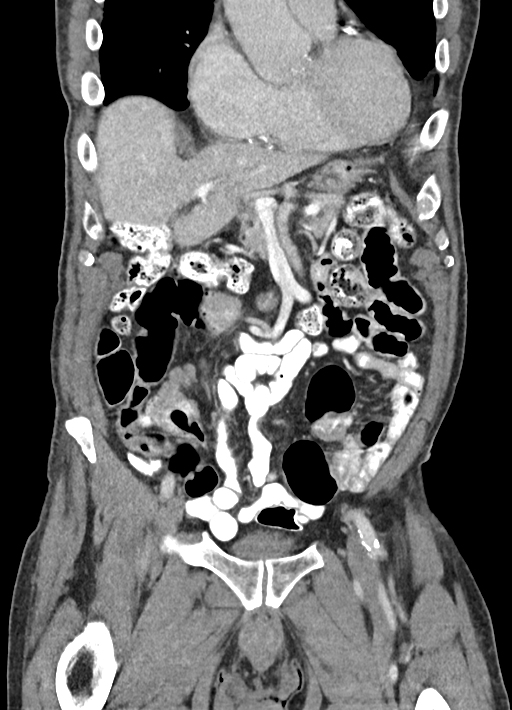
[im 59/132  soft-tissue]
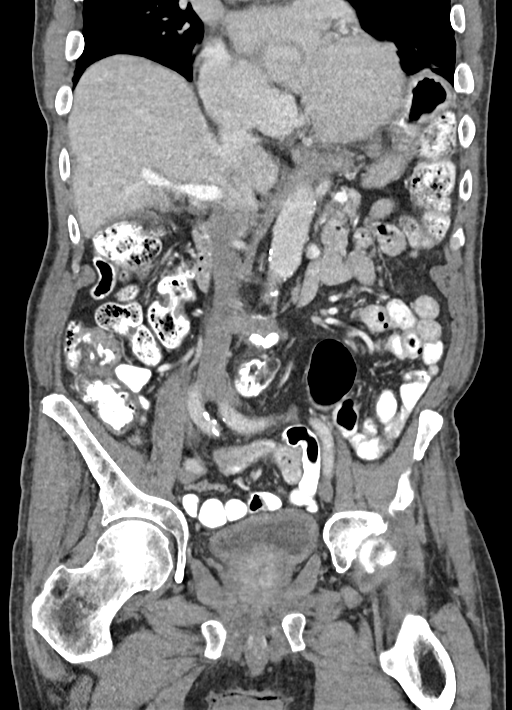
[im 73/132  soft-tissue]
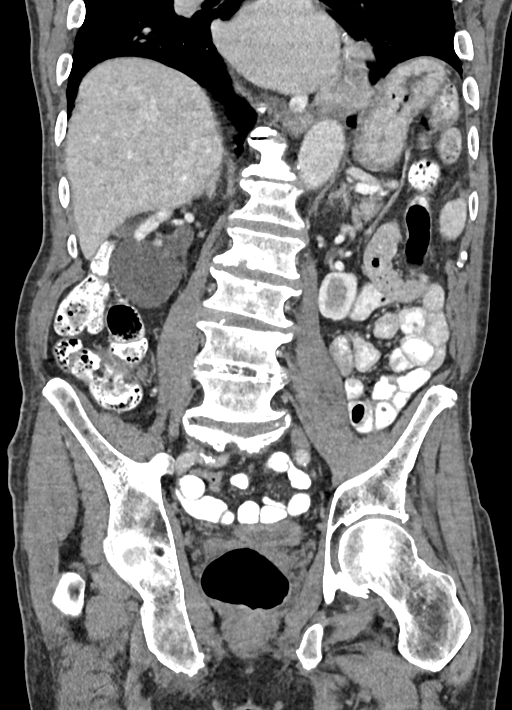

[12 of 46 positions shown; findings below may reference images not displayed]

RADIATION DOSE REDUCTION: This exam was performed according to the
departmental dose-optimization program which includes automated
exposure control, adjustment of the mA and/or kV according to
patient size and/or use of iterative reconstruction technique.

CONTRAST:  80mL AKNQA3-N00 IOPAMIDOL (AKNQA3-N00) INJECTION 61%
FINDINGS: Lower chest: Cardiomegaly. Coronary artery and aortic
calcifications. No acute findings.

Hepatobiliary: No focal hepatic abnormality. Gallbladder
unremarkable.

Pancreas: No focal abnormality or ductal dilatation.

Spleen: No focal abnormality.  Normal size.

Adrenals/Urinary Tract: Right renal cysts in the lower pole and
parapelvic region, the largest in the lower pole measuring 5.2 cm.
These appear benign. No hydronephrosis or stones. Adrenal glands and
urinary bladder unremarkable.

Stomach/Bowel: Moderate stool burden throughout the colon. Normal
appendix. Stomach, large and small bowel grossly unremarkable.

Vascular/Lymphatic: Aortic atherosclerosis. No evidence of aneurysm
or adenopathy.

Reproductive: No visible focal abnormality.

Other: No free fluid or free air.

Musculoskeletal: No acute bony abnormality. Degenerative disc
disease and facet disease throughout the lumbar spine.
IMPRESSION: Coronary artery disease, aortic atherosclerosis.

Cardiomegaly.

Moderate stool burden in the colon.

No acute findings in the abdomen or pelvis.

## 2024-04-16 ENCOUNTER — Encounter (HOSPITAL_COMMUNITY): Payer: Self-pay | Admitting: General Surgery

## 2024-06-08 ENCOUNTER — Encounter (HOSPITAL_COMMUNITY): Payer: Self-pay | Admitting: Emergency Medicine

## 2024-06-08 ENCOUNTER — Other Ambulatory Visit: Payer: Self-pay

## 2024-06-08 ENCOUNTER — Emergency Department (HOSPITAL_COMMUNITY): Payer: Medicare (Managed Care)

## 2024-06-08 ENCOUNTER — Emergency Department (HOSPITAL_COMMUNITY)
Admission: EM | Admit: 2024-06-08 | Discharge: 2024-06-08 | Disposition: A | Discharge status: Home / Self Care | Payer: Medicare (Managed Care) | Attending: Emergency Medicine | Admitting: Emergency Medicine

## 2024-06-08 DIAGNOSIS — Z7901 Long term (current) use of anticoagulants: Secondary | ICD-10-CM | POA: Diagnosis not present

## 2024-06-08 DIAGNOSIS — R1084 Generalized abdominal pain: Secondary | ICD-10-CM | POA: Insufficient documentation

## 2024-06-08 DIAGNOSIS — I7121 Aneurysm of the ascending aorta, without rupture: Secondary | ICD-10-CM | POA: Diagnosis not present

## 2024-06-08 DIAGNOSIS — R110 Nausea: Secondary | ICD-10-CM | POA: Insufficient documentation

## 2024-06-08 DIAGNOSIS — I4891 Unspecified atrial fibrillation: Secondary | ICD-10-CM | POA: Insufficient documentation

## 2024-06-08 DIAGNOSIS — R55 Syncope and collapse: Secondary | ICD-10-CM | POA: Diagnosis present

## 2024-06-08 DIAGNOSIS — N183 Chronic kidney disease, stage 3 unspecified: Secondary | ICD-10-CM | POA: Insufficient documentation

## 2024-06-08 DIAGNOSIS — D649 Anemia, unspecified: Secondary | ICD-10-CM | POA: Insufficient documentation

## 2024-06-08 DIAGNOSIS — I129 Hypertensive chronic kidney disease with stage 1 through stage 4 chronic kidney disease, or unspecified chronic kidney disease: Secondary | ICD-10-CM | POA: Insufficient documentation

## 2024-06-08 LAB — COMPREHENSIVE METABOLIC PANEL WITH GFR
ALT: 10 U/L (ref 0–44)
AST: 20 U/L (ref 15–41)
Albumin: 4 g/dL (ref 3.5–5.0)
Alkaline Phosphatase: 61 U/L (ref 38–126)
Anion gap: 11 (ref 5–15)
BUN: 20 mg/dL (ref 8–23)
CO2: 24 mmol/L (ref 22–32)
Calcium: 8.7 mg/dL — ABNORMAL LOW (ref 8.9–10.3)
Chloride: 107 mmol/L (ref 98–111)
Creatinine, Ser: 1.93 mg/dL — ABNORMAL HIGH (ref 0.61–1.24)
GFR, Estimated: 33 mL/min — ABNORMAL LOW
Glucose, Bld: 120 mg/dL — ABNORMAL HIGH (ref 70–99)
Potassium: 3.5 mmol/L (ref 3.5–5.1)
Sodium: 142 mmol/L (ref 135–145)
Total Bilirubin: 0.4 mg/dL (ref 0.0–1.2)
Total Protein: 6.5 g/dL (ref 6.5–8.1)

## 2024-06-08 LAB — CBC WITH DIFFERENTIAL/PLATELET
Abs Immature Granulocytes: 0 10*3/uL (ref 0.00–0.07)
Basophils Absolute: 0 10*3/uL (ref 0.0–0.1)
Basophils Relative: 0 %
Eosinophils Absolute: 0.1 10*3/uL (ref 0.0–0.5)
Eosinophils Relative: 3 %
HCT: 30 % — ABNORMAL LOW (ref 39.0–52.0)
Hemoglobin: 10.4 g/dL — ABNORMAL LOW (ref 13.0–17.0)
Immature Granulocytes: 0 %
Lymphocytes Relative: 24 %
Lymphs Abs: 0.9 10*3/uL (ref 0.7–4.0)
MCH: 30.4 pg (ref 26.0–34.0)
MCHC: 34.7 g/dL (ref 30.0–36.0)
MCV: 87.7 fL (ref 80.0–100.0)
Monocytes Absolute: 0.2 10*3/uL (ref 0.1–1.0)
Monocytes Relative: 6 %
Neutro Abs: 2.6 10*3/uL (ref 1.7–7.7)
Neutrophils Relative %: 67 %
Platelets: 109 10*3/uL — ABNORMAL LOW (ref 150–400)
RBC: 3.42 MIL/uL — ABNORMAL LOW (ref 4.22–5.81)
RDW: 13.6 % (ref 11.5–15.5)
WBC: 3.8 10*3/uL — ABNORMAL LOW (ref 4.0–10.5)
nRBC: 0 % (ref 0.0–0.2)

## 2024-06-08 LAB — TROPONIN T, HIGH SENSITIVITY: Troponin T High Sensitivity: 23 ng/L — ABNORMAL HIGH (ref 0–19)

## 2024-06-08 LAB — LIPASE, BLOOD: Lipase: 29 U/L (ref 11–51)

## 2024-06-08 MED ORDER — SENNOSIDES-DOCUSATE SODIUM 8.6-50 MG PO TABS: 1.0000 | ORAL_TABLET | Freq: Every evening | ORAL | 0 refills | Status: AC | PRN

## 2024-06-08 MED ORDER — SODIUM CHLORIDE 0.9 % IV BOLUS
500.0000 mL | Freq: Once | INTRAVENOUS | Status: AC
  Administered 2024-06-08: 500 mL via INTRAVENOUS

## 2024-06-08 MED ORDER — ONDANSETRON 4 MG PO TBDP: 4.0000 mg | ORAL_TABLET | Freq: Three times a day (TID) | ORAL | 0 refills | Status: AC | PRN

## 2024-06-08 MED ORDER — ONDANSETRON HCL 4 MG/2ML IJ SOLN
4.0000 mg | Freq: Once | INTRAMUSCULAR | Status: AC
  Administered 2024-06-08: 4 mg via INTRAVENOUS
  Filled 2024-06-08: qty 2

## 2024-06-08 MED ORDER — IOHEXOL 350 MG/ML SOLN
75.0000 mL | Freq: Once | INTRAVENOUS | Status: AC | PRN
  Administered 2024-06-08: 75 mL via INTRAVENOUS

## 2024-06-08 NOTE — ED Triage Notes (Signed)
 Pt BIB [facility] from home due to generalized abdominal pain for the last 30 minutes that has gotten worse.  Pt denies any intake/output problems or diet issues.  Pt reports near syncopal episode at home. VS BP 116/70, HR 60, Resp 16, SpO2 97%RA, CBG 163

## 2024-06-08 NOTE — Discharge Instructions (Addendum)
 You were seen in the emerged from today with abdominal pain.  Your CT scan did not show any acute findings.  I am starting you on some constipation medicines which may help with your symptoms.  Please drink plenty of fluids.  Your CT scan also showed some enlargement of the aorta to 4.2 cm.  Your primary care doctor can help with keeping track of this with additional scans to make sure it is not suddenly getting larger.  I do not think this is causing your pain.

## 2024-06-08 NOTE — ED Notes (Signed)
 Pt transported to CT ?

## 2024-06-08 NOTE — ED Notes (Signed)
 CCMD called by this RN
